# Patient Record
Sex: Male | Born: 1965
Health system: Southern US, Community
[De-identification: ages and names within clinical notes are randomized; demographics above are authoritative.]

## PROBLEM LIST (undated history)

## (undated) DIAGNOSIS — C61 Malignant neoplasm of prostate: Secondary | ICD-10-CM

## (undated) DIAGNOSIS — F79 Unspecified intellectual disabilities: Secondary | ICD-10-CM

## (undated) DIAGNOSIS — I10 Essential (primary) hypertension: Secondary | ICD-10-CM

## (undated) HISTORY — PX: COLONOSCOPY: SHX174

---

## 2011-11-12 ENCOUNTER — Encounter (HOSPITAL_COMMUNITY): Payer: Self-pay | Admitting: *Deleted

## 2011-11-12 ENCOUNTER — Emergency Department (HOSPITAL_COMMUNITY)
Admission: EM | Admit: 2011-11-12 | Discharge: 2011-11-12 | Disposition: A | Discharge status: Home / Self Care | Payer: Medicare Other | Attending: Emergency Medicine | Admitting: Emergency Medicine

## 2011-11-12 DIAGNOSIS — L2989 Other pruritus: Secondary | ICD-10-CM | POA: Insufficient documentation

## 2011-11-12 DIAGNOSIS — T6391XA Toxic effect of contact with unspecified venomous animal, accidental (unintentional), initial encounter: Secondary | ICD-10-CM | POA: Insufficient documentation

## 2011-11-12 DIAGNOSIS — Z79899 Other long term (current) drug therapy: Secondary | ICD-10-CM | POA: Insufficient documentation

## 2011-11-12 DIAGNOSIS — I10 Essential (primary) hypertension: Secondary | ICD-10-CM | POA: Insufficient documentation

## 2011-11-12 DIAGNOSIS — T63441A Toxic effect of venom of bees, accidental (unintentional), initial encounter: Secondary | ICD-10-CM

## 2011-11-12 DIAGNOSIS — R221 Localized swelling, mass and lump, neck: Secondary | ICD-10-CM | POA: Insufficient documentation

## 2011-11-12 DIAGNOSIS — R220 Localized swelling, mass and lump, head: Secondary | ICD-10-CM | POA: Insufficient documentation

## 2011-11-12 DIAGNOSIS — R21 Rash and other nonspecific skin eruption: Secondary | ICD-10-CM | POA: Insufficient documentation

## 2011-11-12 DIAGNOSIS — L298 Other pruritus: Secondary | ICD-10-CM | POA: Insufficient documentation

## 2011-11-12 DIAGNOSIS — T63461A Toxic effect of venom of wasps, accidental (unintentional), initial encounter: Secondary | ICD-10-CM | POA: Insufficient documentation

## 2011-11-12 HISTORY — DX: Essential (primary) hypertension: I10

## 2011-11-12 MED ORDER — BENADRYL 25 MG PO TABS: 25.0000 mg | ORAL_TABLET | Freq: Four times a day (QID) | ORAL | Status: DC | PRN

## 2011-11-12 MED ORDER — PREDNISONE 20 MG PO TABS
60.0000 mg | ORAL_TABLET | Freq: Once | ORAL | Status: AC
  Administered 2011-11-12: 60 mg via ORAL
  Filled 2011-11-12: qty 3

## 2011-11-12 MED ORDER — PREDNISONE 10 MG PO TABS: 20.0000 mg | ORAL_TABLET | Freq: Every day | ORAL | Status: DC

## 2011-11-12 MED ORDER — DIPHENHYDRAMINE HCL 25 MG PO CAPS
50.0000 mg | ORAL_CAPSULE | Freq: Once | ORAL | Status: AC
  Administered 2011-11-12: 50 mg via ORAL
  Filled 2011-11-12: qty 2

## 2011-11-12 NOTE — Discharge Instructions (Signed)
Mr Koepp take benadryl 25mg  every 4 hours x 24 then three times a day.  Take the prednisone as directed.  Follow up with your pcp tomorrow if not better.  If your condition worsens return to the ER especially if difficulty breathing or swollen tongue.    Bee, Wasp, or Hornet Sting Your caregiver has diagnosed you as having an insect sting. An insect sting appears as a red lump in the skin that sometimes has a tiny hole in the center, or it may have a stinger in the center of the wound. The most common stings are from wasps, hornets and bees. Individuals have different reactions to insect stings.  A normal reaction may cause pain, swelling, and redness around the sting site.   A localized allergic reaction may cause swelling and redness that extends beyond the sting site.   A large local reaction may continue to develop over the next 12 to 36 hours.   On occasion, the reactions can be severe (anaphylactic reaction). An anaphylactic reaction may cause wheezing; difficulty breathing; chest pain; fainting; raised, itchy, red patches on the skin; a sick feeling to your stomach (nausea); vomiting; cramping; or diarrhea. If you have had an anaphylactic reaction to an insect sting in the past, you are more likely to have one again.  HOME CARE INSTRUCTIONS   With bee stings, a small sac of poison is left in the wound. Brushing across this with something such as a credit card, or anything similar, will help remove this and decrease the amount of the reaction. This same procedure will not help a wasp sting as they do not leave behind a stinger and poison sac.   Apply a cold compress for 10 to 20 minutes every hour for 1 to 2 days, depending on severity, to reduce swelling and itching.   To lessen pain, a paste made of water and baking soda may be rubbed on the bite or sting and left on for 5 minutes.   To relieve itching and swelling, you may use take medication or apply medicated creams or lotions as  directed.   Only take over-the-counter or prescription medicines for pain, discomfort, or fever as directed by your caregiver.   Wash the sting site daily with soap and water. Apply antibiotic ointment on the sting site as directed.   If you suffered a severe reaction:   If you did not require hospitalization, an adult will need to stay with you for 24 hours in case the symptoms return.   You may need to wear a medical bracelet or necklace stating the allergy.   You and your family need to learn when and how to use an anaphylaxis kit or epinephrine injection.   If you have had a severe reaction before, always carry your anaphylaxis kit with you.  SEEK MEDICAL CARE IF:   None of the above helps within 2 to 3 days.   The area becomes red, warm, tender, and swollen beyond the area of the bite or sting.   You have an oral temperature above 102 F (38.9 C).  SEEK IMMEDIATE MEDICAL CARE IF:  You have symptoms of an allergic reaction which are:  Wheezing.   Difficulty breathing.   Chest pain.   Lightheadedness or fainting.   Itchy, raised, red patches on the skin.   Nausea, vomiting, cramping or diarrhea.  ANY OF THESE SYMPTOMS MAY REPRESENT A SERIOUS PROBLEM THAT IS AN EMERGENCY. Do not wait to see if the symptoms will  go away. Get medical help right away. Call your local emergency services (911 in [place] DO NOT drive yourself to the hospital. MAKE SURE YOU:   Understand these instructions.   Will watch your condition.   Will get help right away if you are not doing well or get worse.  Document Released: 04/30/2005 Document Revised: 04/19/2011 Document Reviewed: 10/15/2009 [facility] Patient Information 2012 [place], [place].

## 2011-11-12 NOTE — ED Notes (Signed)
The pt was stung by a bee to the lt side of his face at approx 1500 today pain and swelling.  No rash no itching.  He says he was not stung on his face just his lt upper arm.  No swelling in the back of his throat no diffi breathing

## 2011-11-12 NOTE — ED Notes (Addendum)
Pt here with family, here s/p "bee sting", occurred around 1530 while cutting grass, pt alert, NAD, calm, interactive, watching TV, family x2 at side, no meds PTA, no h/o bee sting or insect reaction, NKDA or food allergies (denies: pain, numbness, tingling, itching, sob, nv, dizziness, blurred vision, weakness, hot or cold or any other sx). Redness and swelling noted to L upper arm (bicep area), and L lower lip, LS CTA, throat appears unremarkable, no obvious swelling. Ice applied PTA.

## 2011-11-17 NOTE — ED Provider Notes (Signed)
History     CSN: [id number]  Arrival date & time 11/12/11  1836   First MD Initiated Contact with Patient 11/12/11 2118      Chief Complaint  Patient presents with  . Insect Bite    (Consider location/radiation/quality/duration/timing/severity/associated sxs/prior treatment) Patient is a 46 y.o. male presenting with allergic reaction. The history is provided by the patient. No language interpreter was used.  Allergic Reaction The primary symptoms are  rash. The primary symptoms do not include wheezing, shortness of breath, nausea, vomiting or dizziness. The current episode started 3 to 5 hours ago. The problem has been gradually worsening. This is a new problem.  The rash is associated with itching.  The onset of the reaction was associated with insect bite/sting. Significant symptoms also include itching.  Sting to LUE and ? L face with redness/itching to LUE site and LL lip swelling.  No tongue swelling, wheezing or difficulty breathing.  Mentally challenged patient here with his dad.    Past Medical History  Diagnosis Date  . Hypertension     History reviewed. No pertinent past surgical history.  No family history on file.  History  Substance Use Topics  . Smoking status: Never Smoker   . Smokeless tobacco: Not on file  . Alcohol Use: No      Review of Systems  Constitutional: Negative.   HENT: Negative.   Eyes: Negative.   Respiratory: Negative.  Negative for shortness of breath and wheezing.   Cardiovascular: Negative.   Gastrointestinal: Negative.  Negative for nausea and vomiting.  Skin: Positive for itching and rash.  Neurological: Negative.  Negative for dizziness.  Psychiatric/Behavioral: Negative.   All other systems reviewed and are negative.    Allergies  Review of patient's allergies indicates no known allergies.  Home Medications   Current Outpatient Rx  Name Route Sig Dispense Refill  . HYDROCHLOROTHIAZIDE 12.5 MG PO CAPS Oral Take 12.5 mg  by mouth 3 (three) times a week. Take one Monday, Wednesday, and Friday    . OLMESARTAN MEDOXOMIL 20 MG PO TABS Oral Take 20 mg by mouth daily.    Marland Kitchen VITAMIN D (ERGOCALCIFEROL) 50000 UNITS PO CAPS Oral Take 50,000 Units by mouth 2 (two) times a week. Take on Tuesday and Friday    . BENADRYL 25 MG PO TABS Oral Take 1 tablet (25 mg total) by mouth every 6 (six) hours as needed for itching. 20 tablet 0    Dispense as written.  Marland Kitchen PREDNISONE 10 MG PO TABS Oral Take 2 tablets (20 mg total) by mouth daily. 15 tablet 0    BP 139/89  Pulse 92  Temp 97.1 F (36.2 C) (Oral)  Resp 18  SpO2 99%  Physical Exam  Nursing note and vitals reviewed. Constitutional: He is oriented to person, place, and time. He appears well-developed and well-nourished.  HENT:  Head: Normocephalic.  Eyes: Conjunctivae and EOM are normal. Pupils are equal, round, and reactive to light.  Neck: Normal range of motion. Neck supple.  Cardiovascular: Normal rate.   Pulmonary/Chest: Effort normal.  Abdominal: Soft.  Musculoskeletal: Normal range of motion.  Neurological: He is alert and oriented to person, place, and time.  Skin: Skin is warm and dry. Rash noted.  Psychiatric: He has a normal mood and affect.    ED Course  Procedures (including critical care time)  Labs Reviewed - No data to display No results found.   1. Bee sting       MDM  Bee  sting to LUE and ? L face.  Better after benadryl and prednisone in the ER.  Follow up with pcp as needed.  Return for any tongue swelling or difficulty breathing.  rx for benadryl and prednisone.        Remi Haggard, NP 11/17/11 1043

## 2011-11-19 NOTE — ED Provider Notes (Signed)
Medical screening examination/treatment/procedure(s) were performed by non-physician practitioner and as supervising physician I was immediately available for consultation/collaboration.  Erling Arrazola, MD 11/19/11 0715 

## 2015-01-26 ENCOUNTER — Encounter (HOSPITAL_COMMUNITY): Payer: Self-pay | Admitting: Emergency Medicine

## 2015-01-26 ENCOUNTER — Emergency Department (HOSPITAL_COMMUNITY)
Admission: EM | Admit: 2015-01-26 | Discharge: 2015-01-26 | Disposition: A | Discharge status: Home / Self Care | Payer: Commercial Managed Care - HMO | Attending: Emergency Medicine | Admitting: Emergency Medicine

## 2015-01-26 DIAGNOSIS — K068 Other specified disorders of gingiva and edentulous alveolar ridge: Secondary | ICD-10-CM | POA: Diagnosis not present

## 2015-01-26 DIAGNOSIS — Z79899 Other long term (current) drug therapy: Secondary | ICD-10-CM | POA: Insufficient documentation

## 2015-01-26 DIAGNOSIS — I10 Essential (primary) hypertension: Secondary | ICD-10-CM | POA: Insufficient documentation

## 2015-01-26 DIAGNOSIS — D699 Hemorrhagic condition, unspecified: Secondary | ICD-10-CM | POA: Diagnosis not present

## 2015-01-26 DIAGNOSIS — K0501 Acute gingivitis, non-plaque induced: Secondary | ICD-10-CM | POA: Diagnosis not present

## 2015-01-26 DIAGNOSIS — F79 Unspecified intellectual disabilities: Secondary | ICD-10-CM | POA: Diagnosis not present

## 2015-01-26 DIAGNOSIS — Z7952 Long term (current) use of systemic steroids: Secondary | ICD-10-CM | POA: Diagnosis not present

## 2015-01-26 HISTORY — DX: Unspecified intellectual disabilities: F79

## 2015-01-26 MED ORDER — CHLORHEXIDINE GLUCONATE 0.12% ORAL RINSE (MEDLINE KIT): 15.0000 mL | Freq: Two times a day (BID) | OROMUCOSAL | Status: DC

## 2015-01-26 NOTE — Discharge Instructions (Signed)
Return to the ED with any concerns including bleeding not able to be controlled with pressure, difficulty breathing, decreased level of alertness/lethargy, or any other alarming symptoms

## 2015-01-26 NOTE — ED Provider Notes (Signed)
CSN: [id number]     Arrival date & time 01/26/15  0700 History   First MD Initiated Contact with Patient 01/26/15 [phone]     Chief Complaint  Patient presents with  . Epistaxis     (Consider location/radiation/quality/duration/timing/severity/associated sxs/prior Treatment) HPI  Pt presenting with c/o bleeding from gums.  He had his teeth cleaned at the dentist 2 days ago.  Today he woke up with blood in his mouth.  Bleeding appears to be coming from his gums.  No injury.   Does not take blood thinners.  No other areas of easy bruising or bleeding.  He has not had any treatment prior to arrival.  No coughing or vomiting.  No nosebleed.  No difficulty breathing.  There are no other associated systemic symptoms, there are no other alleviating or modifying factors.   Past Medical History  Diagnosis Date  . Hypertension   . Mental disability    History reviewed. No pertinent past surgical history. No family history on file. Social History  Substance Use Topics  . Smoking status: Never Smoker   . Smokeless tobacco: None  . Alcohol Use: No    Review of Systems  ROS reviewed and all otherwise negative except for mentioned in HPI    Allergies  Bee venom  Home Medications   Prior to Admission medications   Medication Sig Start Date End Date Taking? Authorizing Provider  BENADRYL 25 MG tablet Take 1 tablet (25 mg total) by mouth every 6 (six) hours as needed for itching. 11/12/11 12/12/11  Sheryle Hail, NP  chlorhexidine gluconate (PERIDEX) 0.12 % solution Use as directed 15 mLs in the mouth or throat 2 (two) times daily. 01/26/15   Alfonzo Beers, MD  hydrochlorothiazide (MICROZIDE) 12.5 MG capsule Take 12.5 mg by mouth 3 (three) times a week. Take one Monday, Wednesday, and Friday    Historical Provider, MD  olmesartan (BENICAR) 20 MG tablet Take 20 mg by mouth daily.    Historical Provider, MD  predniSONE (DELTASONE) 10 MG tablet Take 2 tablets (20 mg total) by mouth daily. 11/12/11    Sheryle Hail, NP  Vitamin D, Ergocalciferol, (DRISDOL) 50000 UNITS CAPS Take 50,000 Units by mouth 2 (two) times a week. Take on Tuesday and Friday    Historical Provider, MD   BP 123/77 mmHg  Pulse 88  Temp(Src) 98.3 F (36.8 C) (Oral)  Resp 15  SpO2 98%  Vitals reviewed Physical Exam  Physical Examination: General appearance - alert, well appearing, and in no distress Mental status - alert, oriented to person, place, and time Eyes - no conjunctival injection, no scleral icterus Mouth - mucous membranes moist, pharynx normal without lesions, dried blood around lips, when blood wiped away from gums, there is area of bleeding around left upper molars diffusely, no laceration or abscess present Chest - clear to auscultation, no wheezes, rales or rhonchi, symmetric air entry Heart - normal rate, regular rhythm, normal S1, S2, no murmurs, rubs, clicks or gallops Neurological - alert, oriented, normal speech, moving all extremities Extremities - peripheral pulses normal, no pedal edema, no clubbing or cyanosis Skin - normal coloration and turgor, no rashes  ED Course  Procedures (including critical care time) Labs Review Labs Reviewed - No data to display  Imaging Review No results found. I have personally reviewed and evaluated these images and lab results as part of my medical decision-making.   EKG Interpretation None      MDM   Final diagnoses:  Gingival  bleeding    Pt presenting with c/o gums bleeding.  He had teeth cleaned 2 days ago. He does not floss regularly.  There is no laceration or specific injury, but diffuse bleeding around left upper molars.  Given rx for peridex, advised regular flossing.  Discharged with strict return precautions.  Pt agreeable with plan.    Alfonzo Beers, MD 01/26/15 [phone]

## 2015-01-26 NOTE — ED Notes (Addendum)
pts caregiver states that the patient woke up with blood on his pillow and blood in his mouth. Caregiver reports he has not really seen any blood coming from the patient's nose. Pt alert, denies pain or any other complaints. resp e/u. Skin warm and dry.

## 2015-01-28 DIAGNOSIS — Z79899 Other long term (current) drug therapy: Secondary | ICD-10-CM | POA: Diagnosis not present

## 2015-01-28 DIAGNOSIS — K0501 Acute gingivitis, non-plaque induced: Secondary | ICD-10-CM | POA: Diagnosis not present

## 2015-01-28 DIAGNOSIS — Z23 Encounter for immunization: Secondary | ICD-10-CM | POA: Diagnosis not present

## 2015-01-28 DIAGNOSIS — I10 Essential (primary) hypertension: Secondary | ICD-10-CM | POA: Diagnosis not present

## 2015-02-18 DIAGNOSIS — Z23 Encounter for immunization: Secondary | ICD-10-CM | POA: Diagnosis not present

## 2015-02-18 DIAGNOSIS — Z125 Encounter for screening for malignant neoplasm of prostate: Secondary | ICD-10-CM | POA: Diagnosis not present

## 2015-02-18 DIAGNOSIS — Z1211 Encounter for screening for malignant neoplasm of colon: Secondary | ICD-10-CM | POA: Diagnosis not present

## 2015-02-18 DIAGNOSIS — Z79899 Other long term (current) drug therapy: Secondary | ICD-10-CM | POA: Diagnosis not present

## 2015-02-18 DIAGNOSIS — Z0000 Encounter for general adult medical examination without abnormal findings: Secondary | ICD-10-CM | POA: Diagnosis not present

## 2015-02-18 DIAGNOSIS — I10 Essential (primary) hypertension: Secondary | ICD-10-CM | POA: Diagnosis not present

## 2016-06-04 DIAGNOSIS — M79673 Pain in unspecified foot: Secondary | ICD-10-CM | POA: Diagnosis not present

## 2016-06-04 DIAGNOSIS — I10 Essential (primary) hypertension: Secondary | ICD-10-CM | POA: Diagnosis not present

## 2016-06-04 DIAGNOSIS — Z79899 Other long term (current) drug therapy: Secondary | ICD-10-CM | POA: Diagnosis not present

## 2016-06-04 DIAGNOSIS — Z23 Encounter for immunization: Secondary | ICD-10-CM | POA: Diagnosis not present

## 2016-10-12 DIAGNOSIS — Z79899 Other long term (current) drug therapy: Secondary | ICD-10-CM | POA: Diagnosis not present

## 2016-10-12 DIAGNOSIS — Z0000 Encounter for general adult medical examination without abnormal findings: Secondary | ICD-10-CM | POA: Diagnosis not present

## 2016-10-12 DIAGNOSIS — Z1211 Encounter for screening for malignant neoplasm of colon: Secondary | ICD-10-CM | POA: Diagnosis not present

## 2016-10-12 DIAGNOSIS — I10 Essential (primary) hypertension: Secondary | ICD-10-CM | POA: Diagnosis not present

## 2016-10-12 DIAGNOSIS — Z125 Encounter for screening for malignant neoplasm of prostate: Secondary | ICD-10-CM | POA: Diagnosis not present

## 2017-05-17 DIAGNOSIS — I10 Essential (primary) hypertension: Secondary | ICD-10-CM | POA: Diagnosis not present

## 2017-06-28 DIAGNOSIS — Z23 Encounter for immunization: Secondary | ICD-10-CM | POA: Diagnosis not present

## 2017-10-24 DIAGNOSIS — I10 Essential (primary) hypertension: Secondary | ICD-10-CM | POA: Diagnosis not present

## 2017-10-24 DIAGNOSIS — F70 Mild intellectual disabilities: Secondary | ICD-10-CM | POA: Diagnosis not present

## 2017-10-24 DIAGNOSIS — E559 Vitamin D deficiency, unspecified: Secondary | ICD-10-CM | POA: Diagnosis not present

## 2018-04-01 ENCOUNTER — Other Ambulatory Visit: Payer: Self-pay | Admitting: Nurse Practitioner

## 2018-04-04 ENCOUNTER — Other Ambulatory Visit: Payer: Self-pay | Admitting: Internal Medicine

## 2018-04-06 ENCOUNTER — Other Ambulatory Visit: Payer: Self-pay | Admitting: Internal Medicine

## 2018-04-24 ENCOUNTER — Ambulatory Visit: Payer: Self-pay | Admitting: Nurse Practitioner

## 2018-05-28 ENCOUNTER — Encounter: Payer: Self-pay | Admitting: Nurse Practitioner

## 2018-05-28 ENCOUNTER — Ambulatory Visit (INDEPENDENT_AMBULATORY_CARE_PROVIDER_SITE_OTHER): Payer: Medicare HMO | Admitting: Nurse Practitioner

## 2018-05-28 VITALS — BP 120/82 | HR 72 | Temp 98.2°F | Ht 68.0 in | Wt 183.4 lb

## 2018-05-28 DIAGNOSIS — I10 Essential (primary) hypertension: Secondary | ICD-10-CM

## 2018-05-28 MED ORDER — OLMESARTAN MEDOXOMIL 20 MG PO TABS: 20.0000 mg | ORAL_TABLET | Freq: Every day | ORAL | 1 refills | Status: DC

## 2018-05-28 NOTE — Patient Instructions (Signed)

## 2018-05-28 NOTE — Progress Notes (Signed)
  Subjective:     Patient ID: Christopher Watson , male    DOB: 04-07-1966 , 53 y.o.   MRN: [id number]   Chief Complaint  Patient presents with  . Hypertension    HPI  Hypertension  This is a chronic problem. The current episode started more than 1 year ago. The problem is unchanged. The problem is controlled. Pertinent negatives include no anxiety, chest pain, headaches or palpitations. There are no associated agents to hypertension. Risk factors for coronary artery disease include sedentary lifestyle and male gender. Past treatments include diuretics and angiotensin blockers. The current treatment provides significant improvement. There are no compliance problems.  There is no history of angina or kidney disease. There is no history of chronic renal disease.     Past Medical History:  Diagnosis Date  . Hypertension   . Mental disability      History reviewed. No pertinent family history.   Current Outpatient Medications:  .  hydrochlorothiazide (MICROZIDE) 12.5 MG capsule, TAKE 1 CAPSULE BY MOUTH ON MONDAY, WEDNESDAY, AND FRIDAY, Disp: 38 capsule, Rfl: 0 .  olmesartan (BENICAR) 20 MG tablet, TAKE 1 TABLET BY MOUTH DAILY, Disp: 90 tablet, Rfl: 0   Allergies  Allergen Reactions  . Bee Venom Swelling     Review of Systems  Constitutional: Negative.   Respiratory: Negative.  Negative for cough.   Cardiovascular: Negative.  Negative for chest pain, palpitations and leg swelling.  Neurological: Negative for dizziness and headaches.     Today's Vitals   05/28/18 1112  BP: 120/82  Pulse: 72  Temp: 98.2 F (36.8 C)  TempSrc: Oral  SpO2: 92%  Weight: 183 lb 6.4 oz (83.2 kg)  Height: 5\' 8"  (1.727 m)  PainSc: 0-No pain   Body mass index is 27.89 kg/m.   Objective:  Physical Exam Vitals signs reviewed.  Constitutional:      General: He is not in acute distress.    Appearance: Normal appearance.  Cardiovascular:     Rate and Rhythm: Normal rate and regular rhythm.   Pulses: Normal pulses.     Heart sounds: Normal heart sounds. No murmur.  Pulmonary:     Effort: Pulmonary effort is normal. No respiratory distress.     Breath sounds: Normal breath sounds.  Skin:    General: Skin is warm and dry.     Capillary Refill: Capillary refill takes less than 2 seconds.  Neurological:     General: No focal deficit present.     Mental Status: He is alert and oriented to person, place, and time.  Psychiatric:        Mood and Affect: Mood normal.        Behavior: Behavior normal.        Thought Content: Thought content normal.        Judgment: Judgment normal.         Assessment And Plan:     1. Essential hypertension . B/P is controlled.  Marland Kitchen BMP ordered to check renal function.  . The importance of regular exercise and dietary modification was stressed to the patient.  - olmesartan (BENICAR) 20 MG tablet; Take 1 tablet (20 mg total) by mouth daily.  Dispense: 90 tablet; Refill: [place], FNP

## 2018-05-29 LAB — BMP8+EGFR
BUN / CREAT RATIO: 17 (ref 9–20)
BUN: 12 mg/dL (ref 6–24)
CHLORIDE: 101 mmol/L (ref 96–106)
CO2: 23 mmol/L (ref 20–29)
Calcium: 9.6 mg/dL (ref 8.7–10.2)
Creatinine, Ser: 0.72 mg/dL — ABNORMAL LOW (ref 0.76–1.27)
GFR calc non Af Amer: 107 mL/min/{1.73_m2} (ref >59)
GFR, EST AFRICAN AMERICAN: 124 mL/min/{1.73_m2} (ref >59)
Glucose: 94 mg/dL (ref 65–99)
Potassium: 4.4 mmol/L (ref 3.5–5.2)
Sodium: 138 mmol/L (ref 134–144)

## 2018-09-16 ENCOUNTER — Other Ambulatory Visit: Payer: Self-pay | Admitting: Nurse Practitioner

## 2018-11-20 ENCOUNTER — Other Ambulatory Visit: Payer: Self-pay | Admitting: Nurse Practitioner

## 2018-11-20 DIAGNOSIS — I10 Essential (primary) hypertension: Secondary | ICD-10-CM

## 2018-11-26 ENCOUNTER — Ambulatory Visit: Payer: Medicare HMO

## 2018-11-26 ENCOUNTER — Ambulatory Visit: Payer: Medicare HMO | Admitting: Nurse Practitioner

## 2018-12-02 ENCOUNTER — Encounter: Payer: Self-pay | Admitting: Nurse Practitioner

## 2018-12-02 ENCOUNTER — Other Ambulatory Visit: Payer: Self-pay

## 2018-12-02 ENCOUNTER — Ambulatory Visit (INDEPENDENT_AMBULATORY_CARE_PROVIDER_SITE_OTHER): Payer: Medicare HMO

## 2018-12-02 ENCOUNTER — Ambulatory Visit (INDEPENDENT_AMBULATORY_CARE_PROVIDER_SITE_OTHER): Payer: Medicare HMO | Admitting: Nurse Practitioner

## 2018-12-02 VITALS — BP 114/70 | HR 86 | Temp 98.8°F | Ht 68.6 in | Wt 176.4 lb

## 2018-12-02 VITALS — BP 114/70 | HR 86 | Temp 98.8°F | Ht 68.6 in | Wt 176.0 lb

## 2018-12-02 DIAGNOSIS — I10 Essential (primary) hypertension: Secondary | ICD-10-CM

## 2018-12-02 DIAGNOSIS — Z0000 Encounter for general adult medical examination without abnormal findings: Secondary | ICD-10-CM

## 2018-12-02 LAB — POCT URINALYSIS DIPSTICK
Bilirubin, UA: NEGATIVE
Blood, UA: NEGATIVE
Glucose, UA: NEGATIVE
Ketones, UA: 15
Leukocytes, UA: NEGATIVE
Nitrite, UA: NEGATIVE
Protein, UA: NEGATIVE
Spec Grav, UA: >=1.03 — AB (ref 1.010–1.025)
Urobilinogen, UA: 0.2 E.U./dL
pH, UA: 5.5 (ref 5.0–8.0)

## 2018-12-02 LAB — POCT UA - MICROALBUMIN
Albumin/Creatinine Ratio, Urine, POC: <30
Creatinine, POC: 300 mg/dL
Microalbumin Ur, POC: 30 mg/L

## 2018-12-02 NOTE — Patient Instructions (Signed)
Christopher Watson , Thank you for taking time to come for your Medicare Wellness Visit. I appreciate your ongoing commitment to your health goals. Please review the following plan we discussed and let me know if I can assist you in the future.   Screening recommendations/referrals: Colonoscopy: decline Recommended yearly ophthalmology/optometry visit for glaucoma screening and checkup Recommended yearly dental visit for hygiene and checkup  Vaccinations: Influenza vaccine: 2019 Pneumococcal vaccine: n/a Tdap vaccine: decline Shingles vaccine: discussed    Advanced directives: Advance directive discussed with you today. Even though you declined this today please call our office should you change your mind and we can give you the proper paperwork for you to fill out.   Conditions/risks identified: overweight  Next appointment: 05/21/2019 at 10:00  Preventive Care 40-64 Years, Male Preventive care refers to lifestyle choices and visits with your health care provider that can promote health and wellness. What does preventive care include?  A yearly physical exam. This is also called an annual well check.  Dental exams once or twice a year.  Routine eye exams. Ask your health care provider how often you should have your eyes checked.  Personal lifestyle choices, including:  Daily care of your teeth and gums.  Regular physical activity.  Eating a healthy diet.  Avoiding tobacco and drug use.  Limiting alcohol use.  Practicing safe sex.  Taking low-dose aspirin every day starting at age 72. What happens during an annual well check? The services and screenings done by your health care provider during your annual well check will depend on your age, overall health, lifestyle risk factors, and family history of disease. Counseling  Your health care provider may ask you questions about your:  Alcohol use.  Tobacco use.  Drug use.  Emotional well-being.  Home and relationship  well-being.  Sexual activity.  Eating habits.  Work and work Statistician. Screening  You may have the following tests or measurements:  Height, weight, and BMI.  Blood pressure.  Lipid and cholesterol levels. These may be checked every 5 years, or more frequently if you are over 27 years old.  Skin check.  Lung cancer screening. You may have this screening every year starting at age 4 if you have a 30-pack-year history of smoking and currently smoke or have quit within the past 15 years.  Fecal occult blood test (FOBT) of the stool. You may have this test every year starting at age 53.  Flexible sigmoidoscopy or colonoscopy. You may have a sigmoidoscopy every 5 years or a colonoscopy every 10 years starting at age 49.  Prostate cancer screening. Recommendations will vary depending on your family history and other risks.  Hepatitis C blood test.  Hepatitis B blood test.  Sexually transmitted disease (STD) testing.  Diabetes screening. This is done by checking your blood sugar (glucose) after you have not eaten for a while (fasting). You may have this done every 1-3 years. Discuss your test results, treatment options, and if necessary, the need for more tests with your health care provider. Vaccines  Your health care provider may recommend certain vaccines, such as:  Influenza vaccine. This is recommended every year.  Tetanus, diphtheria, and acellular pertussis (Tdap, Td) vaccine. You may need a Td booster every 10 years.  Zoster vaccine. You may need this after age 26.  Pneumococcal 13-valent conjugate (PCV13) vaccine. You may need this if you have certain conditions and have not been vaccinated.  Pneumococcal polysaccharide (PPSV23) vaccine. You may need one or two  doses if you smoke cigarettes or if you have certain conditions. Talk to your health care provider about which screenings and vaccines you need and how often you need them. This information is not intended  to replace advice given to you by your health care provider. Make sure you discuss any questions you have with your health care provider. Document Released: 05/27/2015 Document Revised: 01/18/2016 Document Reviewed: 03/01/2015 Elsevier Interactive Patient Education  2017 [place] Prevention in the Home Falls can cause injuries. They can happen to people of all ages. There are many things you can do to make your home safe and to help prevent falls. What can I do on the outside of my home?  Regularly fix the edges of walkways and driveways and fix any cracks.  Remove anything that might make you trip as you walk through a door, such as a raised step or threshold.  Trim any bushes or trees on the path to your home.  Use bright outdoor lighting.  Clear any walking paths of anything that might make someone trip, such as rocks or tools.  Regularly check to see if handrails are loose or broken. Make sure that both sides of any steps have handrails.  Any raised decks and porches should have guardrails on the edges.  Have any leaves, snow, or ice cleared regularly.  Use sand or salt on walking paths during winter.  Clean up any spills in your garage right away. This includes oil or grease spills. What can I do in the bathroom?  Use night lights.  Install grab bars by the toilet and in the tub and shower. Do not use towel bars as grab bars.  Use non-skid mats or decals in the tub or shower.  If you need to sit down in the shower, use a plastic, non-slip stool.  Keep the floor dry. Clean up any water that spills on the floor as soon as it happens.  Remove soap buildup in the tub or shower regularly.  Attach bath mats securely with double-sided non-slip rug tape.  Do not have throw rugs and other things on the floor that can make you trip. What can I do in the bedroom?  Use night lights.  Make sure that you have a light by your bed that is easy to reach.  Do not use  any sheets or blankets that are too big for your bed. They should not hang down onto the floor.  Have a firm chair that has side arms. You can use this for support while you get dressed.  Do not have throw rugs and other things on the floor that can make you trip. What can I do in the kitchen?  Clean up any spills right away.  Avoid walking on wet floors.  Keep items that you use a lot in easy-to-reach places.  If you need to reach something above you, use a strong step stool that has a grab bar.  Keep electrical cords out of the way.  Do not use floor polish or wax that makes floors slippery. If you must use wax, use non-skid floor wax.  Do not have throw rugs and other things on the floor that can make you trip. What can I do with my stairs?  Do not leave any items on the stairs.  Make sure that there are handrails on both sides of the stairs and use them. Fix handrails that are broken or loose. Make sure that handrails are as  long as the stairways.  Check any carpeting to make sure that it is firmly attached to the stairs. Fix any carpet that is loose or worn.  Avoid having throw rugs at the top or bottom of the stairs. If you do have throw rugs, attach them to the floor with carpet tape.  Make sure that you have a light switch at the top of the stairs and the bottom of the stairs. If you do not have them, ask someone to add them for you. What else can I do to help prevent falls?  Wear shoes that:  Do not have high heels.  Have rubber bottoms.  Are comfortable and fit you well.  Are closed at the toe. Do not wear sandals.  If you use a stepladder:  Make sure that it is fully opened. Do not climb a closed stepladder.  Make sure that both sides of the stepladder are locked into place.  Ask someone to hold it for you, if possible.  Clearly mark and make sure that you can see:  Any grab bars or handrails.  First and last steps.  Where the edge of each step is.   Use tools that help you move around (mobility aids) if they are needed. These include:  Canes.  Walkers.  Scooters.  Crutches.  Turn on the lights when you go into a dark area. Replace any light bulbs as soon as they burn out.  Set up your furniture so you have a clear path. Avoid moving your furniture around.  If any of your floors are uneven, fix them.  If there are any pets around you, be aware of where they are.  Review your medicines with your doctor. Some medicines can make you feel dizzy. This can increase your chance of falling. Ask your doctor what other things that you can do to help prevent falls. This information is not intended to replace advice given to you by your health care provider. Make sure you discuss any questions you have with your health care provider. Document Released: 02/24/2009 Document Revised: 10/06/2015 Document Reviewed: 06/04/2014 Elsevier Interactive Patient Education  2017 [employer].

## 2018-12-02 NOTE — Progress Notes (Signed)
  Subjective:     Patient ID: Christopher Watson , male    DOB: December 25, 1965 , 53 y.o.   MRN: [id number]   Chief Complaint  Patient presents with  . Hypertension    HPI  Wt Readings from Last 3 Encounters: 12/02/18 : 176 lb (79.8 kg) 12/02/18 : 176 lb 6.4 oz (80 kg) 05/28/18 : 183 lb 6.4 oz (83.2 kg)  Planning to have a tooth pulled.  Check on cologuard.  Hypertension This is a chronic problem. The current episode started more than 1 year ago. The problem is unchanged. The problem is controlled. Pertinent negatives include no anxiety, chest pain, headaches or palpitations. There are no associated agents to hypertension. Risk factors for coronary artery disease include sedentary lifestyle. Past treatments include diuretics and angiotensin blockers. There are no compliance problems.  There is no history of angina. There is no history of chronic renal disease.     Past Medical History:  Diagnosis Date  . Hypertension   . Mental disability      Family History  Problem Relation Age of Onset  . Hypertension Father      Current Outpatient Medications:  .  hydrochlorothiazide (MICROZIDE) 12.5 MG capsule, TAKE ONE CAPSULE BY MOUTH ON MONDAY, WEDNESDAY, AND FRIDAY, Disp: 90 capsule, Rfl: 0 .  olmesartan (BENICAR) 20 MG tablet, TAKE 1 TABLET(20 MG) BY MOUTH DAILY, Disp: 90 tablet, Rfl: 1   Allergies  Allergen Reactions  . Bee Venom Swelling     Review of Systems  Constitutional: Negative.   Respiratory: Negative.   Cardiovascular: Negative.  Negative for chest pain, palpitations and leg swelling.  Skin: Negative.   Neurological: Negative for dizziness and headaches.  Psychiatric/Behavioral: Negative for agitation and confusion.     Today's Vitals   12/02/18 1105  BP: 114/70  Pulse: 86  Temp: 98.8 F (37.1 C)  TempSrc: Oral  Weight: 176 lb (79.8 kg)  Height: 5' 8.6" (1.742 m)   Body mass index is 26.29 kg/m.   Objective:  Physical Exam Constitutional:      Appearance:  Normal appearance.  Skin:    General: Skin is warm and dry.     Capillary Refill: Capillary refill takes less than 2 seconds.  Neurological:     Mental Status: He is alert.         Assessment And Plan:     1. Essential hypertension . B/P is well controlled.  . CMP ordered to check renal function.  . The importance of regular exercise and dietary modification was stressed to the patient. Discussed ways to increase physical activity - CMP14 + Anion Gap       [name], FNP    THE PATIENT IS ENCOURAGED TO PRACTICE SOCIAL DISTANCING DUE TO THE COVID-19 PANDEMIC.

## 2018-12-02 NOTE — Progress Notes (Signed)
Subjective:   Christopher Watson is a 53 y.o. male who presents for Medicare Annual/Subsequent preventive examination.  Review of Systems:  n/a Cardiac Risk Factors include: hypertension;male gender;sedentary lifestyle     Objective:    Vitals: BP 114/70 (BP Location: Left Arm, Patient Position: Sitting, Cuff Size: Normal)   Pulse 86   Temp 98.8 F (37.1 C) (Oral)   Ht 5' 8.6" (1.742 m)   Wt 176 lb 6.4 oz (80 kg)   SpO2 98%   BMI 26.35 kg/m   Body mass index is 26.35 kg/m.  Advanced Directives 12/02/2018 01/26/2015  Does Patient Have a Medical Advance Directive? No No  Would patient like information on creating a medical advance directive? No - Patient declined No - patient declined information    Tobacco Social History   Tobacco Use  Smoking Status Never Smoker  Smokeless Tobacco Never Used     Counseling given: Not Answered   Clinical Intake:  Pre-visit preparation completed: Yes  Pain : No/denies pain     Nutritional Status: BMI 25 -29 Overweight Nutritional Risks: None Diabetes: No  How often do you need to have someone help you when you read instructions, pamphlets, or other written materials from your doctor or pharmacy?: 1 - Never What is the last grade level you completed in school?: 12th grade  Interpreter Needed?: No  Information entered by :: NAllen LPN  Past Medical History:  Diagnosis Date  . Hypertension   . Mental disability    History reviewed. No pertinent surgical history. Family History  Problem Relation Age of Onset  . Hypertension Father    Social History   Socioeconomic History  . Marital status: Single    Spouse name: Not on file  . Number of children: Not on file  . Years of education: Not on file  . Highest education level: Not on file  Occupational History  . Occupation: unemployment  Social Needs  . Financial resource strain: Not hard at all  . Food insecurity    Worry: Never true    Inability: Never true  .  Transportation needs    Medical: No    Non-medical: No  Tobacco Use  . Smoking status: Never Smoker  . Smokeless tobacco: Never Used  Substance and Sexual Activity  . Alcohol use: No  . Drug use: Not Currently  . Sexual activity: Not Currently  Lifestyle  . Physical activity    Days per week: 0 days    Minutes per session: 0 min  . Stress: Not at all  Relationships  . Social Herbalist on phone: Not on file    Gets together: Not on file    Attends religious service: Not on file    Active member of club or organization: Not on file    Attends meetings of clubs or organizations: Not on file    Relationship status: Not on file  Other Topics Concern  . Not on file  Social History Narrative  . Not on file    Outpatient Encounter Medications as of 12/02/2018  Medication Sig  . hydrochlorothiazide (MICROZIDE) 12.5 MG capsule TAKE ONE CAPSULE BY MOUTH ON MONDAY, WEDNESDAY, AND FRIDAY  . olmesartan (BENICAR) 20 MG tablet TAKE 1 TABLET(20 MG) BY MOUTH DAILY   No facility-administered encounter medications on file as of 12/02/2018.     Activities of Daily Living In your present state of health, do you have any difficulty performing the following activities: 12/02/2018  Hearing? N  Vision? N  Difficulty concentrating or making decisions? N  Walking or climbing stairs? N  Dressing or bathing? N  Doing errands, shopping? Y  Comment always has someone with him  Preparing Food and eating ? N  Using the Toilet? N  In the past six months, have you accidently leaked urine? N  Do you have problems with loss of bowel control? N  Managing your Medications? Y  Comment father gives medications  Managing your Finances? Y  Comment father Engineer, production or managing your Housekeeping? N  Some recent data might be hidden    Patient Care Team: [name], FNP as PCP - General (General Practice)   Assessment:   This is a routine wellness examination for Christopher Watson.   Exercise Activities and Dietary recommendations Current Exercise Habits: The patient does not participate in regular exercise at present  Goals    . Exercise 150 min/wk Moderate Activity     12/02/2018, wants to start exercising       Fall Risk Fall Risk  12/02/2018 05/28/2018  Falls in the past year? 0 0  Number falls in past yr: 0 -  Risk for fall due to : Medication side effect -  Follow up Falls evaluation completed;Education provided;Falls prevention discussed -   Is the patient's home free of loose throw rugs in walkways, pet beds, electrical cords, etc?   yes      Grab bars in the bathroom? no      Handrails on the stairs?   yes      Adequate lighting?   yes  Timed Get Up and Go Performed: n/a  Depression Screen PHQ 2/9 Scores 12/02/2018 05/28/2018  PHQ - 2 Score 0 0  PHQ- 9 Score 0 -    Cognitive Function     6CIT Screen 12/02/2018  What Year? 4 points  What month? 3 points  What time? 3 points  Count back from 20 4 points  Months in reverse 4 points  Repeat phrase 10 points  Total Score 28     There is no immunization history on file for this patient.  Qualifies for Shingles Vaccine? yes  Screening Tests Health Maintenance  Topic Date Due  . HIV Screening  02/27/1981  . TETANUS/TDAP  02/27/1985  . COLONOSCOPY  02/28/2016  . INFLUENZA VACCINE  12/13/2018   Cancer Screenings: Lung: Low Dose CT Chest recommended if Age 62-80 years, 30 pack-year currently smoking OR have quit w/in 15years. Patient does not qualify. Colorectal: declines  Additional Screenings:  Hepatitis C Screening:n/a      Plan:   6 CIT was 28. Wants to start exercising.  I have personally reviewed and noted the following in the patient's chart:   . Medical and social history . Use of alcohol, tobacco or illicit drugs  . Current medications and supplements . Functional ability and status . Nutritional status . Physical activity . Advanced directives . List of other  physicians . Hospitalizations, surgeries, and ER visits in previous 12 months . Vitals . Screenings to include cognitive, depression, and falls . Referrals and appointments  In addition, I have reviewed and discussed with patient certain preventive protocols, quality metrics, and best practice recommendations. A written personalized care plan for preventive services as well as general preventive health recommendations were provided to patient.     Kellie Simmering, LPN  [id number]

## 2018-12-02 NOTE — Addendum Note (Signed)
Addended by: Glenna Durand E on: 12/02/2018 12:55 PM   Modules accepted: Orders

## 2018-12-03 LAB — CMP14 + ANION GAP
ALT: 10 IU/L (ref 0–44)
AST: 12 IU/L (ref 0–40)
Albumin/Globulin Ratio: 1.7 (ref 1.2–2.2)
Albumin: 4.8 g/dL (ref 3.8–4.9)
Alkaline Phosphatase: 93 IU/L (ref 39–117)
Anion Gap: 21 mmol/L — ABNORMAL HIGH (ref 10.0–18.0)
BUN/Creatinine Ratio: 15 (ref 9–20)
BUN: 14 mg/dL (ref 6–24)
Bilirubin Total: 0.6 mg/dL (ref 0.0–1.2)
CO2: 20 mmol/L (ref 20–29)
Calcium: 10 mg/dL (ref 8.7–10.2)
Chloride: 102 mmol/L (ref 96–106)
Creatinine, Ser: 0.92 mg/dL (ref 0.76–1.27)
GFR calc Af Amer: 110 mL/min/{1.73_m2} (ref >59)
GFR calc non Af Amer: 95 mL/min/{1.73_m2} (ref >59)
Globulin, Total: 2.8 g/dL (ref 1.5–4.5)
Glucose: 89 mg/dL (ref 65–99)
Potassium: 4.5 mmol/L (ref 3.5–5.2)
Sodium: 143 mmol/L (ref 134–144)
Total Protein: 7.6 g/dL (ref 6.0–8.5)

## 2019-01-01 ENCOUNTER — Telehealth: Payer: Self-pay | Admitting: Nurse Practitioner

## 2019-01-01 NOTE — Chronic Care Management (AMB) (Signed)
°  Chronic Care Management   Outreach Note  01/01/2019 Name: Christopher Watson MRN: [id number] DOB: 03/03/1966  Referred by: [name], FNP Reason for referral : Chronic Care Management (Initial CCM outreach was unsuccessful.)   An unsuccessful telephone outreach was attempted today. The patient was referred to the case management team by for assistance with chronic care management and care coordination.   Follow Up Plan: A HIPPA compliant phone message was left for the patient providing contact information and requesting a return call.  The care management team will reach out to the patient again over the next 7 days.  If patient returns call to provider office, please advise to call [place] at [place]  [email or web address]   ??[phone]

## 2019-01-08 NOTE — Chronic Care Management (AMB) (Signed)
°  Chronic Care Management   Outreach Note  01/08/2019 Name: Christopher Watson MRN: [id number] DOB: 1965/07/31  Referred by: [name], FNP Reason for referral : Chronic Care Management (Initial CCM outreach was unsuccessful.) and Chronic Care Management (Second CCM outreach was unsuccessful.)   A second unsuccessful telephone outreach was attempted today. The patient was referred to the case management team for assistance with chronic care management and care coordination.   Follow Up Plan: A HIPPA compliant phone message was left for the patient providing contact information and requesting a return call.  The care management team will reach out to the patient again over the next 7 days.  If patient returns call to provider office, please advise to call [place] at [place]  [email or web address]   ??[phone]

## 2019-01-12 ENCOUNTER — Telehealth: Payer: Self-pay | Admitting: Nurse Practitioner

## 2019-01-12 NOTE — Chronic Care Management (AMB) (Signed)
Chronic Care Management   Note  01/12/2019 Name: Christopher Watson MRN: [id number] DOB: 09/01/1965  Christopher Watson is a 53 y.o. year old male who is a primary care patient of [name], [place]. I reached out to Carolin Guernsey by phone today in response to a referral sent by Christopher Watson [facility] health plan.    [name] was given information about Chronic Care Management services today including:  1. CCM service includes personalized support from designated clinical staff supervised by his physician, including individualized plan of care and coordination with other care providers 2. 24/7 contact phone numbers for assistance for urgent and routine care needs. 3. Service will only be billed when office clinical staff spend 20 minutes or more in a month to coordinate care. 4. Only one practitioner may furnish and bill the service in a calendar month. 5. The patient may stop CCM services at any time (effective at the end of the month) by phone call to the office staff. 6. The patient will be responsible for cost sharing (co-pay) of up to 20% of the service fee (after annual deductible is met).  Patient agreed to services and verbal consent obtained.   Follow up plan: Telephone appointment with CCM team member scheduled for: 02/23/2019  [place]  [email or web address]   ??[phone]

## 2019-01-12 NOTE — Chronic Care Management (AMB) (Signed)
°  Chronic Care Management   Outreach Note  01/12/2019 Name: Christopher Watson MRN: [id number] DOB: 18-Sep-1965  Referred by: [name], FNP Reason for referral : Chronic Care Management (Initial CCM outreach was unsuccessful.), Chronic Care Management (Second CCM outreach was unsuccessful.), and Chronic Care Management (Third CCM outreach was unsuccessful.)   Third unsuccessful telephone outreach was attempted today. The patient was referred to the case management team for assistance with chronic care management and care coordination. The patient's primary care provider has been notified of our unsuccessful attempts to make or maintain contact with the patient. The care management team is pleased to engage with this patient at any time in the future should he/she be interested in assistance from the care management team.   Follow Up Plan: The care management team is available to follow up with the patient after provider conversation with the patient regarding recommendation for care management engagement and subsequent re-referral to the care management team.   [place]  [email or web address]   ??[phone]

## 2019-01-12 NOTE — Chronic Care Management (AMB) (Deleted)
Chronic Care Management   Note  01/12/2019 Name: Christopher Watson MRN: [id number] DOB: 30-Jun-1965  Christopher Watson is a 53 y.o. year old male who is a primary care patient of [name], [place]. I reached out to Carolin Guernsey by phone today in response to a referral sent by Christopher Watson [facility] health plan.    Christopher Watson was given information about Chronic Care Management services today including:  1. CCM service includes personalized support from designated clinical staff supervised by his physician, including individualized plan of care and coordination with other care providers 2. 24/7 contact phone numbers for assistance for urgent and routine care needs. 3. Service will only be billed when office clinical staff spend 20 minutes or more in a month to coordinate care. 4. Only one practitioner may furnish and bill the service in a calendar month. 5. The patient may stop CCM services at any time (effective at the end of the month) by phone call to the office staff. 6. The patient will be responsible for cost sharing (co-pay) of up to 20% of the service fee (after annual deductible is met).  Patients father Christopher Watson agreed to services and verbal consent obtained.   Follow up plan: Telephone appointment with CCM team member scheduled for: 02/20/2019  [place]  [email or web address]   ??[phone]

## 2019-01-12 NOTE — Chronic Care Management (AMB) (Signed)
Encounter made in error. 

## 2019-02-20 ENCOUNTER — Telehealth: Payer: Self-pay

## 2019-02-23 ENCOUNTER — Telehealth: Payer: Medicare HMO

## 2019-03-31 ENCOUNTER — Ambulatory Visit: Payer: Self-pay

## 2019-03-31 ENCOUNTER — Telehealth: Payer: Self-pay

## 2019-03-31 DIAGNOSIS — I10 Essential (primary) hypertension: Secondary | ICD-10-CM

## 2019-03-31 DIAGNOSIS — F79 Unspecified intellectual disabilities: Secondary | ICD-10-CM

## 2019-03-31 NOTE — Chronic Care Management (AMB) (Signed)
  Chronic Care Management   Outreach Note  03/31/2019 Name: Christopher Watson MRN: [id number] DOB: January 18, 1966  Referred by: [name], FNP Reason for referral : Chronic Care Management (INITIAL CCM RNCM Telephone Outreach)   An unsuccessful telephone outreach was attempted today. The patient was referred to the case management team by [name] FNP for assistance with care management and care coordination.   Follow Up Plan: Telephone follow up appointment with care management team member scheduled for: 04/21/19  Barb Merino, RN, BSN, CCM Care Management Coordinator [place] [facility]  Direct Phone: [phone]

## 2019-04-21 ENCOUNTER — Telehealth: Payer: Self-pay

## 2019-05-21 ENCOUNTER — Ambulatory Visit (INDEPENDENT_AMBULATORY_CARE_PROVIDER_SITE_OTHER): Payer: Medicare HMO | Admitting: Nurse Practitioner

## 2019-05-21 ENCOUNTER — Encounter: Payer: Self-pay | Admitting: Nurse Practitioner

## 2019-05-21 ENCOUNTER — Other Ambulatory Visit: Payer: Self-pay

## 2019-05-21 ENCOUNTER — Ambulatory Visit (INDEPENDENT_AMBULATORY_CARE_PROVIDER_SITE_OTHER): Payer: Medicare HMO

## 2019-05-21 VITALS — BP 122/76 | HR 94 | Temp 97.8°F | Ht 68.6 in | Wt 178.0 lb

## 2019-05-21 VITALS — BP 122/76 | HR 94 | Temp 97.8°F | Ht 68.6 in | Wt 178.6 lb

## 2019-05-21 DIAGNOSIS — Z0000 Encounter for general adult medical examination without abnormal findings: Secondary | ICD-10-CM

## 2019-05-21 DIAGNOSIS — Z79899 Other long term (current) drug therapy: Secondary | ICD-10-CM | POA: Diagnosis not present

## 2019-05-21 DIAGNOSIS — I10 Essential (primary) hypertension: Secondary | ICD-10-CM

## 2019-05-21 DIAGNOSIS — Z125 Encounter for screening for malignant neoplasm of prostate: Secondary | ICD-10-CM

## 2019-05-21 LAB — POCT URINALYSIS DIPSTICK
Bilirubin, UA: NEGATIVE
Blood, UA: NEGATIVE
Glucose, UA: NEGATIVE
Leukocytes, UA: NEGATIVE
Nitrite, UA: NEGATIVE
Protein, UA: NEGATIVE
Spec Grav, UA: 1.025 (ref 1.010–1.025)
Urobilinogen, UA: 1 E.U./dL
pH, UA: 7 (ref 5.0–8.0)

## 2019-05-21 LAB — POCT UA - MICROALBUMIN
Albumin/Creatinine Ratio, Urine, POC: 30
Creatinine, POC: 200 mg/dL
Microalbumin Ur, POC: 10 mg/L

## 2019-05-21 MED ORDER — OLMESARTAN MEDOXOMIL 20 MG PO TABS: 20.0000 mg | ORAL_TABLET | Freq: Every day | ORAL | 1 refills | Status: DC

## 2019-05-21 NOTE — Patient Instructions (Signed)
Health Maintenance  Topic Date Due  . INFLUENZA VACCINE  08/12/2019 (Originally 12/13/2018)  . COLONOSCOPY  12/02/2019 (Originally 02/28/2016)  . TETANUS/TDAP  12/02/2019 (Originally 02/27/1985)  . HIV Screening  12/02/2019 (Originally 02/27/1981)   Health Maintenance, Male Adopting a healthy lifestyle and getting preventive care are important in promoting health and wellness. Ask your health care provider about:  The right schedule for you to have regular tests and exams.  Things you can do on your own to prevent diseases and keep yourself healthy. What should I know about diet, weight, and exercise? Eat a healthy diet   Eat a diet that includes plenty of vegetables, fruits, low-fat dairy products, and lean protein.  Do not eat a lot of foods that are high in solid fats, added sugars, or sodium. Maintain a healthy weight Body mass index (BMI) is a measurement that can be used to identify possible weight problems. It estimates body fat based on height and weight. Your health care provider can help determine your BMI and help you achieve or maintain a healthy weight. Get regular exercise Get regular exercise. This is one of the most important things you can do for your health. Most adults should:  Exercise for at least 150 minutes each week. The exercise should increase your heart rate and make you sweat (moderate-intensity exercise).  Do strengthening exercises at least twice a week. This is in addition to the moderate-intensity exercise.  Spend less time sitting. Even light physical activity can be beneficial. Watch cholesterol and blood lipids Have your blood tested for lipids and cholesterol at 54 years of age, then have this test every 5 years. You may need to have your cholesterol levels checked more often if:  Your lipid or cholesterol levels are high.  You are older than 54 years of age.  You are at high risk for heart disease. What should I know about cancer  screening? Many types of cancers can be detected early and may often be prevented. Depending on your health history and family history, you may need to have cancer screening at various ages. This may include screening for:  Colorectal cancer.  Prostate cancer.  Skin cancer.  Lung cancer. What should I know about heart disease, diabetes, and high blood pressure? Blood pressure and heart disease  High blood pressure causes heart disease and increases the risk of stroke. This is more likely to develop in people who have high blood pressure readings, are of African descent, or are overweight.  Talk with your health care provider about your target blood pressure readings.  Have your blood pressure checked: ? Every 3-5 years if you are 23-53 years of age. ? Every year if you are 68 years old or older.  If you are between the ages of 72 and 17 and are a current or former smoker, ask your health care provider if you should have a one-time screening for abdominal aortic aneurysm (AAA). Diabetes Have regular diabetes screenings. This checks your fasting blood sugar level. Have the screening done:  Once every three years after age 43 if you are at a normal weight and have a low risk for diabetes.  More often and at a younger age if you are overweight or have a high risk for diabetes. What should I know about preventing infection? Hepatitis B If you have a higher risk for hepatitis B, you should be screened for this virus. Talk with your health care provider to find out if you are at risk  for hepatitis B infection. Hepatitis C Blood testing is recommended for:  Everyone born from [birth year] through 1965.  Anyone with known risk factors for hepatitis C. Sexually transmitted infections (STIs)  You should be screened each year for STIs, including gonorrhea and chlamydia, if: ? You are sexually active and are younger than 54 years of age. ? You are older than 54 years of age and your health care  provider tells you that you are at risk for this type of infection. ? Your sexual activity has changed since you were last screened, and you are at increased risk for chlamydia or gonorrhea. Ask your health care provider if you are at risk.  Ask your health care provider about whether you are at high risk for HIV. Your health care provider may recommend a prescription medicine to help prevent HIV infection. If you choose to take medicine to prevent HIV, you should first get tested for HIV. You should then be tested every 3 months for as long as you are taking the medicine. Follow these instructions at home: Lifestyle  Do not use any products that contain nicotine or tobacco, such as cigarettes, e-cigarettes, and chewing tobacco. If you need help quitting, ask your health care provider.  Do not use street drugs.  Do not share needles.  Ask your health care provider for help if you need support or information about quitting drugs. Alcohol use  Do not drink alcohol if your health care provider tells you not to drink.  If you drink alcohol: ? Limit how much you have to 0-2 drinks a day. ? Be aware of how much alcohol is in your drink. In the [place] one drink equals one 12 oz bottle of beer (355 mL), one 5 oz glass of wine (148 mL), or one 1 oz glass of hard liquor (44 mL). General instructions  Schedule regular health, dental, and eye exams.  Stay current with your vaccines.  Tell your health care provider if: ? You often feel depressed. ? You have ever been abused or do not feel safe at home. Summary  Adopting a healthy lifestyle and getting preventive care are important in promoting health and wellness.  Follow your health care provider's instructions about healthy diet, exercising, and getting tested or screened for diseases.  Follow your health care provider's instructions on monitoring your cholesterol and blood pressure. This information is not intended to replace advice given  to you by your health care provider. Make sure you discuss any questions you have with your health care provider. Document Revised: 04/23/2018 Document Reviewed: 04/23/2018 Elsevier Patient Education  2020 [employer].

## 2019-05-21 NOTE — Progress Notes (Signed)
This visit occurred during the SARS-CoV-2 public health emergency.  Safety protocols were in place, including screening questions prior to the visit, additional usage of staff PPE, and extensive cleaning of exam room while observing appropriate contact time as indicated for disinfecting solutions.  Subjective:   Christopher Watson is a 54 y.o. male who presents for Medicare Annual/Subsequent preventive examination.  Review of Systems:  n/a Cardiac Risk Factors include: hypertension;male gender     Objective:    Vitals: BP 122/76 (BP Location: Left Arm, Patient Position: Sitting)   Pulse 94   Temp 97.8 F (36.6 C) (Oral)   Ht 5' 8.6" (1.742 m)   Wt 178 lb (80.7 kg)   BMI 26.59 kg/m   Body mass index is 26.59 kg/m.  Advanced Directives 05/21/2019 12/02/2018 01/26/2015  Does Patient Have a Medical Advance Directive? No No No  Would patient like information on creating a medical advance directive? - No - Patient declined No - patient declined information    Tobacco Social History   Tobacco Use  Smoking Status Never Smoker  Smokeless Tobacco Never Used     Counseling given: Not Answered   Clinical Intake:  Pre-visit preparation completed: Yes  Pain : No/denies pain     Nutritional Status: BMI 25 -29 Overweight Nutritional Risks: None Diabetes: No     Interpreter Needed?: No  Information entered by :: NAllen LPN  Past Medical History:  Diagnosis Date  . Hypertension   . Mental disability    History reviewed. No pertinent surgical history. Family History  Problem Relation Age of Onset  . Hypertension Father    Social History   Socioeconomic History  . Marital status: Single    Spouse name: Not on file  . Number of children: Not on file  . Years of education: Not on file  . Highest education level: Not on file  Occupational History  . Occupation: unemployment  Tobacco Use  . Smoking status: Never Smoker  . Smokeless tobacco: Never Used  Substance and  Sexual Activity  . Alcohol use: No  . Drug use: Not Currently  . Sexual activity: Not Currently  Other Topics Concern  . Not on file  Social History Narrative  . Not on file   Social Determinants of Health   Financial Resource Strain: Low Risk   . Difficulty of Paying Living Expenses: Not hard at all  Food Insecurity: No Food Insecurity  . Worried About Charity fundraiser in the Last Year: Never true  . Ran Out of Food in the Last Year: Never true  Transportation Needs: No Transportation Needs  . Lack of Transportation (Medical): No  . Lack of Transportation (Non-Medical): No  Physical Activity: Inactive  . Days of Exercise per Week: 3 days  . Minutes of Exercise per Session: 0 min  Stress: No Stress Concern Present  . Feeling of Stress : Not at all  Social Connections:   . Frequency of Communication with Friends and Family: Not on file  . Frequency of Social Gatherings with Friends and Family: Not on file  . Attends Religious Services: Not on file  . Active Member of Clubs or Organizations: Not on file  . Attends Archivist Meetings: Not on file  . Marital Status: Not on file    Outpatient Encounter Medications as of 05/21/2019  Medication Sig  . hydrochlorothiazide (MICROZIDE) 12.5 MG capsule TAKE ONE CAPSULE BY MOUTH ON MONDAY, WEDNESDAY, AND FRIDAY  . olmesartan (BENICAR) 20 MG tablet  TAKE 1 TABLET(20 MG) BY MOUTH DAILY   No facility-administered encounter medications on file as of 05/21/2019.    Activities of Daily Living In your present state of health, do you have any difficulty performing the following activities: 05/21/2019 12/02/2018  Hearing? N N  Vision? N N  Difficulty concentrating or making decisions? Y N  Walking or climbing stairs? N N  Dressing or bathing? N N  Doing errands, shopping? Tempie Donning  Comment always has someone with him always has someone with him  Preparing Food and eating ? N N  Using the Toilet? N N  In the past six months, have you  accidently leaked urine? N N  Do you have problems with loss of bowel control? N N  Managing your Medications? Y Y  Comment dad overlooks father gives medications  Managing your Finances? Tempie Donning  Comment dad handles father manages  Housekeeping or managing your Housekeeping? N N  Some recent data might be hidden    Patient Care Team: [name], FNP as PCP - General (General Practice) Little, Claudette Stapler, RN as [facility]   Assessment:   This is a routine wellness examination for Ayham.  Exercise Activities and Dietary recommendations Current Exercise Habits: Home exercise routine, Type of exercise: walking, Frequency (Times/Week): 3  Goals    . Exercise 150 min/wk Moderate Activity     12/02/2018, wants to start exercising    . Patient Stated     05/21/2019, no goals       Fall Risk Fall Risk  05/21/2019 05/21/2019 12/02/2018 05/28/2018  Falls in the past year? 0 0 0 0  Number falls in past yr: - - 0 -  Risk for fall due to : Medication side effect - Medication side effect -  Follow up Falls evaluation completed;Education provided;Falls prevention discussed - Falls evaluation completed;Education provided;Falls prevention discussed -   Is the patient's home free of loose throw rugs in walkways, pet beds, electrical cords, etc?   yes      Grab bars in the bathroom? no      Handrails on the stairs?   yes      Adequate lighting?   yes  Timed Get Up and Go Performed: n/a  Depression Screen PHQ 2/9 Scores 05/21/2019 05/21/2019 12/02/2018 05/28/2018  PHQ - 2 Score 0 0 0 0  PHQ- 9 Score 0 - 0 -    Cognitive Function     6CIT Screen 12/02/2018  What Year? 4 points  What month? 3 points  What time? 3 points  Count back from 20 4 points  Months in reverse 4 points  Repeat phrase 10 points  Total Score 28     There is no immunization history on file for this patient.  Qualifies for Shingles Vaccine? yes  Screening Tests Health Maintenance  Topic Date  Due  . INFLUENZA VACCINE  08/12/2019 (Originally 12/13/2018)  . COLONOSCOPY  12/02/2019 (Originally 02/28/2016)  . TETANUS/TDAP  12/02/2019 (Originally 02/27/1985)  . HIV Screening  12/02/2019 (Originally 02/27/1981)   Cancer Screenings: Lung: Low Dose CT Chest recommended if Age 81-80 years, 30 pack-year currently smoking OR have quit w/in 15years. Patient does not qualify. Colorectal: may do cologuard  Additional Screenings:  Hepatitis C Screening:n/a      Plan:    Patient has no goals set at this time. Patient has a mental disability 6 CIT not administered.  I have personally reviewed and noted the following in the patient's  chart:   . Medical and social history . Use of alcohol, tobacco or illicit drugs  . Current medications and supplements . Functional ability and status . Nutritional status . Physical activity . Advanced directives . List of other physicians . Hospitalizations, surgeries, and ER visits in previous 12 months . Vitals . Screenings to include cognitive, depression, and falls . Referrals and appointments  In addition, I have reviewed and discussed with patient certain preventive protocols, quality metrics, and best practice recommendations. A written personalized care plan for preventive services as well as general preventive health recommendations were provided to patient.     Kellie Simmering, LPN  [phone]

## 2019-05-21 NOTE — Patient Instructions (Signed)
[name] , Thank you for taking time to come for your Medicare Wellness Visit. I appreciate your ongoing commitment to your health goals. Please review the following plan we discussed and let me know if I can assist you in the future.   Screening recommendations/referrals: Colonoscopy: may do cologuard Recommended yearly ophthalmology/optometry visit for glaucoma screening and checkup Recommended yearly dental visit for hygiene and checkup  Vaccinations: Influenza vaccine: will get later in season Pneumococcal vaccine: n/a Tdap vaccine: decline Shingles vaccine: decline    Advanced directives: Advance directive discussed with you today. Even though you declined this today please call our office should you change your mind and we can give you the proper paperwork for you to fill out.   Conditions/risks identified: HTN  Next appointment: 12/09/2019 at 11:00  Preventive Care 40-64 Years, Male Preventive care refers to lifestyle choices and visits with your health care provider that can promote health and wellness. What does preventive care include?  A yearly physical exam. This is also called an annual well check.  Dental exams once or twice a year.  Routine eye exams. Ask your health care provider how often you should have your eyes checked.  Personal lifestyle choices, including:  Daily care of your teeth and gums.  Regular physical activity.  Eating a healthy diet.  Avoiding tobacco and drug use.  Limiting alcohol use.  Practicing safe sex.  Taking low-dose aspirin every day starting at age 30. What happens during an annual well check? The services and screenings done by your health care provider during your annual well check will depend on your age, overall health, lifestyle risk factors, and family history of disease. Counseling  Your health care provider may ask you questions about your:  Alcohol use.  Tobacco use.  Drug use.  Emotional  well-being.  Home and relationship well-being.  Sexual activity.  Eating habits.  Work and work Statistician. Screening  You may have the following tests or measurements:  Height, weight, and BMI.  Blood pressure.  Lipid and cholesterol levels. These may be checked every 5 years, or more frequently if you are over 72 years old.  Skin check.  Lung cancer screening. You may have this screening every year starting at age 66 if you have a 30-pack-year history of smoking and currently smoke or have quit within the past 15 years.  Fecal occult blood test (FOBT) of the stool. You may have this test every year starting at age 54.  Flexible sigmoidoscopy or colonoscopy. You may have a sigmoidoscopy every 5 years or a colonoscopy every 10 years starting at age 16.  Prostate cancer screening. Recommendations will vary depending on your family history and other risks.  Hepatitis C blood test.  Hepatitis B blood test.  Sexually transmitted disease (STD) testing.  Diabetes screening. This is done by checking your blood sugar (glucose) after you have not eaten for a while (fasting). You may have this done every 1-3 years. Discuss your test results, treatment options, and if necessary, the need for more tests with your health care provider. Vaccines  Your health care provider may recommend certain vaccines, such as:  Influenza vaccine. This is recommended every year.  Tetanus, diphtheria, and acellular pertussis (Tdap, Td) vaccine. You may need a Td booster every 10 years.  Zoster vaccine. You may need this after age 11.  Pneumococcal 13-valent conjugate (PCV13) vaccine. You may need this if you have certain conditions and have not been vaccinated.  Pneumococcal polysaccharide (PPSV23) vaccine.  You may need one or two doses if you smoke cigarettes or if you have certain conditions. Talk to your health care provider about which screenings and vaccines you need and how often you need  them. This information is not intended to replace advice given to you by your health care provider. Make sure you discuss any questions you have with your health care provider. Document Released: 05/27/2015 Document Revised: 01/18/2016 Document Reviewed: 03/01/2015 Elsevier Interactive Patient Education  2017 [place] Prevention in the Home Falls can cause injuries. They can happen to people of all ages. There are many things you can do to make your home safe and to help prevent falls. What can I do on the outside of my home?  Regularly fix the edges of walkways and driveways and fix any cracks.  Remove anything that might make you trip as you walk through a door, such as a raised step or threshold.  Trim any bushes or trees on the path to your home.  Use bright outdoor lighting.  Clear any walking paths of anything that might make someone trip, such as rocks or tools.  Regularly check to see if handrails are loose or broken. Make sure that both sides of any steps have handrails.  Any raised decks and porches should have guardrails on the edges.  Have any leaves, snow, or ice cleared regularly.  Use sand or salt on walking paths during winter.  Clean up any spills in your garage right away. This includes oil or grease spills. What can I do in the bathroom?  Use night lights.  Install grab bars by the toilet and in the tub and shower. Do not use towel bars as grab bars.  Use non-skid mats or decals in the tub or shower.  If you need to sit down in the shower, use a plastic, non-slip stool.  Keep the floor dry. Clean up any water that spills on the floor as soon as it happens.  Remove soap buildup in the tub or shower regularly.  Attach bath mats securely with double-sided non-slip rug tape.  Do not have throw rugs and other things on the floor that can make you trip. What can I do in the bedroom?  Use night lights.  Make sure that you have a light by your  bed that is easy to reach.  Do not use any sheets or blankets that are too big for your bed. They should not hang down onto the floor.  Have a firm chair that has side arms. You can use this for support while you get dressed.  Do not have throw rugs and other things on the floor that can make you trip. What can I do in the kitchen?  Clean up any spills right away.  Avoid walking on wet floors.  Keep items that you use a lot in easy-to-reach places.  If you need to reach something above you, use a strong step stool that has a grab bar.  Keep electrical cords out of the way.  Do not use floor polish or wax that makes floors slippery. If you must use wax, use non-skid floor wax.  Do not have throw rugs and other things on the floor that can make you trip. What can I do with my stairs?  Do not leave any items on the stairs.  Make sure that there are handrails on both sides of the stairs and use them. Fix handrails that are broken or loose.  Make sure that handrails are as long as the stairways.  Check any carpeting to make sure that it is firmly attached to the stairs. Fix any carpet that is loose or worn.  Avoid having throw rugs at the top or bottom of the stairs. If you do have throw rugs, attach them to the floor with carpet tape.  Make sure that you have a light switch at the top of the stairs and the bottom of the stairs. If you do not have them, ask someone to add them for you. What else can I do to help prevent falls?  Wear shoes that:  Do not have high heels.  Have rubber bottoms.  Are comfortable and fit you well.  Are closed at the toe. Do not wear sandals.  If you use a stepladder:  Make sure that it is fully opened. Do not climb a closed stepladder.  Make sure that both sides of the stepladder are locked into place.  Ask someone to hold it for you, if possible.  Clearly mark and make sure that you can see:  Any grab bars or handrails.  First and last  steps.  Where the edge of each step is.  Use tools that help you move around (mobility aids) if they are needed. These include:  Canes.  Walkers.  Scooters.  Crutches.  Turn on the lights when you go into a dark area. Replace any light bulbs as soon as they burn out.  Set up your furniture so you have a clear path. Avoid moving your furniture around.  If any of your floors are uneven, fix them.  If there are any pets around you, be aware of where they are.  Review your medicines with your doctor. Some medicines can make you feel dizzy. This can increase your chance of falling. Ask your doctor what other things that you can do to help prevent falls. This information is not intended to replace advice given to you by your health care provider. Make sure you discuss any questions you have with your health care provider. Document Released: 02/24/2009 Document Revised: 10/06/2015 Document Reviewed: 06/04/2014 Elsevier Interactive Patient Education  2017 [employer].

## 2019-05-21 NOTE — Progress Notes (Addendum)
This visit occurred during the SARS-CoV-2 public health emergency.  Safety protocols were in place, including screening questions prior to the visit, additional usage of staff PPE, and extensive cleaning of exam room while observing appropriate contact time as indicated for disinfecting solutions.  Subjective:     Patient ID: Christopher Watson , male    DOB: 1966-03-26 , 54 y.o.   MRN: [id number]   Chief Complaint  Patient presents with  . Annual Exam    HPI  Here for HM     Hypertension This is a chronic problem. The current episode started more than 1 year ago. The problem is controlled. Pertinent negatives include no chest pain, headaches or palpitations. Risk factors for coronary artery disease include sedentary lifestyle and male gender. Past treatments include diuretics and angiotensin blockers (his father will hold his HCTZ if blood pressure is low. ). The current treatment provides significant improvement. There are no compliance problems.  There is no history of chronic renal disease.    Men's preventive visit. Patient Health Questionnaire (PHQ-2) is    Office Visit from 05/21/2019 in [facility]  PHQ-2 Total Score  0     Patient is on a Regular diet. Marital status: Single. Relevant history for alcohol use is:  Social History   Substance and Sexual Activity  Alcohol Use No   Relevant history for tobacco use is:  Social History   Tobacco Use  Smoking Status Never Smoker  Smokeless Tobacco Never Used   Past Medical History:  Diagnosis Date  . Hypertension   . Mental disability    Family History  Problem Relation Age of Onset  . Hypertension Father      Current Outpatient Medications:  .  hydrochlorothiazide (MICROZIDE) 12.5 MG capsule, TAKE ONE CAPSULE BY MOUTH ON MONDAY, WEDNESDAY, AND FRIDAY, Disp: 90 capsule, Rfl: 0 .  olmesartan (BENICAR) 20 MG tablet, TAKE 1 TABLET(20 MG) BY MOUTH DAILY, Disp: 90 tablet, Rfl: 1   Allergies   Allergen Reactions  . Bee Venom Swelling     Review of Systems  Constitutional: Negative.   Respiratory: Negative.   Cardiovascular: Negative.  Negative for chest pain, palpitations and leg swelling.  Neurological: Negative for dizziness and headaches.  Psychiatric/Behavioral: Negative.      Today's Vitals   05/21/19 1007  BP: 122/76  Pulse: 94  Temp: 97.8 F (36.6 C)  TempSrc: Oral  Weight: 178 lb 9.6 oz (81 kg)  Height: 5' 8.6" (1.742 m)  PainSc: 0-No pain   Body mass index is 26.68 kg/m.   Objective:  Physical Exam Vitals reviewed.  Constitutional:      Appearance: Normal appearance.  HENT:     Head: Normocephalic.     Right Ear: Tympanic membrane, ear canal and external ear normal. There is no impacted cerumen.     Left Ear: Tympanic membrane, ear canal and external ear normal. There is no impacted cerumen.  Eyes:     Pupils: Pupils are equal, round, and reactive to light.  Cardiovascular:     Rate and Rhythm: Normal rate and regular rhythm.     Pulses: Normal pulses.     Heart sounds: Normal heart sounds. No murmur.  Pulmonary:     Effort: Pulmonary effort is normal. No respiratory distress.     Breath sounds: Normal breath sounds.  Abdominal:     General: Abdomen is flat. Bowel sounds are normal. There is no distension.     Palpations: Abdomen is soft.  Musculoskeletal:        General: Normal range of motion.  Skin:    General: Skin is warm and dry.     Capillary Refill: Capillary refill takes less than 2 seconds.     Coloration: Skin is not jaundiced.  Neurological:     General: No focal deficit present.     Mental Status: He is alert and oriented to person, place, and time.     Cranial Nerves: No cranial nerve deficit.  Psychiatric:        Mood and Affect: Mood normal.        Behavior: Behavior normal.        Thought Content: Thought content normal.        Judgment: Judgment normal.         Assessment And Plan:   1. Health maintenance  examination Behavior modifications discussed and diet history reviewed.   Pt will continue to exercise regularly and modify diet with low GI, plant based foods and decrease intake of processed foods.  Recommend intake of daily multivitamin, Vitamin D, and calcium.  Recommend colonoscopy (his father has declined having a colonscopy) for preventive screenings, as well as recommend immunizations that include influenza, TDAP, and Shingles  2. Essential hypertension . B/P is controlled.  . CMP ordered to check renal function.  . The importance of regular exercise and dietary modification was stressed to the patient.  . EKG done with NSR HR 90 - POCT Urinalysis Dipstick (81002) - POCT UA - Microalbumin - EKG 12-Lead   [name], FNP    THE PATIENT IS ENCOURAGED TO PRACTICE SOCIAL DISTANCING DUE TO THE COVID-19 PANDEMIC.

## 2019-05-22 LAB — CBC
Hematocrit: 45.2 % (ref 37.5–51.0)
Hemoglobin: 15.1 g/dL (ref 13.0–17.7)
MCH: 29.5 pg (ref 26.6–33.0)
MCHC: 33.4 g/dL (ref 31.5–35.7)
MCV: 89 fL (ref 79–97)
Platelets: 240 10*3/uL (ref 150–450)
RBC: 5.11 x10E6/uL (ref 4.14–5.80)
RDW: 12.6 % (ref 11.6–15.4)
WBC: 8.6 10*3/uL (ref 3.4–10.8)

## 2019-05-22 LAB — CMP14+EGFR
ALT: 11 IU/L (ref 0–44)
AST: 14 IU/L (ref 0–40)
Albumin/Globulin Ratio: 1.7 (ref 1.2–2.2)
Albumin: 4.5 g/dL (ref 3.8–4.9)
Alkaline Phosphatase: 90 IU/L (ref 39–117)
BUN/Creatinine Ratio: 13 (ref 9–20)
BUN: 12 mg/dL (ref 6–24)
Bilirubin Total: 0.5 mg/dL (ref 0.0–1.2)
CO2: 26 mmol/L (ref 20–29)
Calcium: 9.5 mg/dL (ref 8.7–10.2)
Chloride: 101 mmol/L (ref 96–106)
Creatinine, Ser: 0.92 mg/dL (ref 0.76–1.27)
GFR calc Af Amer: 109 mL/min/{1.73_m2} (ref >59)
GFR calc non Af Amer: 95 mL/min/{1.73_m2} (ref >59)
Globulin, Total: 2.6 g/dL (ref 1.5–4.5)
Glucose: 90 mg/dL (ref 65–99)
Potassium: 4.8 mmol/L (ref 3.5–5.2)
Sodium: 139 mmol/L (ref 134–144)
Total Protein: 7.1 g/dL (ref 6.0–8.5)

## 2019-05-22 LAB — PSA: Prostate Specific Ag, Serum: 5.1 ng/mL — ABNORMAL HIGH (ref 0.0–4.0)

## 2019-05-25 ENCOUNTER — Telehealth: Payer: Self-pay

## 2019-05-25 NOTE — Telephone Encounter (Signed)
Left vm- 1st attempt to give results

## 2019-05-25 NOTE — Telephone Encounter (Signed)
-----   Message from [name], [place] sent at 05/25/2019 12:16 PM EST ----- Your PSA levels is slightly elevated, have you seen a urologist in the past?  Your kidney and blood levels are normal.

## 2019-08-24 ENCOUNTER — Other Ambulatory Visit: Payer: Self-pay | Admitting: Nurse Practitioner

## 2019-08-24 DIAGNOSIS — I10 Essential (primary) hypertension: Secondary | ICD-10-CM

## 2019-11-20 ENCOUNTER — Other Ambulatory Visit: Payer: Self-pay | Admitting: Nurse Practitioner

## 2019-12-09 ENCOUNTER — Encounter: Payer: Self-pay | Admitting: Nurse Practitioner

## 2019-12-09 ENCOUNTER — Other Ambulatory Visit: Payer: Self-pay

## 2019-12-09 ENCOUNTER — Ambulatory Visit: Payer: Medicare HMO

## 2019-12-09 ENCOUNTER — Ambulatory Visit (INDEPENDENT_AMBULATORY_CARE_PROVIDER_SITE_OTHER): Payer: Medicare HMO | Admitting: Nurse Practitioner

## 2019-12-09 VITALS — BP 114/78 | HR 99 | Temp 97.4°F | Ht 68.6 in | Wt 168.8 lb

## 2019-12-09 DIAGNOSIS — I10 Essential (primary) hypertension: Secondary | ICD-10-CM | POA: Diagnosis not present

## 2019-12-09 DIAGNOSIS — Z1211 Encounter for screening for malignant neoplasm of colon: Secondary | ICD-10-CM

## 2019-12-09 DIAGNOSIS — Z1159 Encounter for screening for other viral diseases: Secondary | ICD-10-CM | POA: Diagnosis not present

## 2019-12-09 NOTE — Patient Instructions (Signed)
COVID-19 Vaccine Information can be found at: [email or web address] For questions related to vaccine distribution or appointments, please email [email or web address] or call [phone].    

## 2019-12-09 NOTE — Progress Notes (Signed)
This visit occurred during the SARS-CoV-2 public health emergency.  Safety protocols were in place, including screening questions prior to the visit, additional usage of staff PPE, and extensive cleaning of exam room while observing appropriate contact time as indicated for disinfecting solutions.  Subjective:     Patient ID: Christopher Watson , male    DOB: [birth year] , 54 y.o.   MRN: [id number]   Chief Complaint  Patient presents with  . Hypertension    HPI  Here for 6 month hypertension follow up.    Wt Readings from Last 3 Encounters: 12/09/19 : 168 lb 12.8 oz (76.6 kg) 05/21/19 : 178 lb (80.7 kg) 05/21/19 : 178 lb 9.6 oz (81 kg)     Past Medical History:  Diagnosis Date  . Hypertension   . Mental disability      Family History  Problem Relation Age of Onset  . Hypertension Father      Current Outpatient Medications:  .  hydrochlorothiazide (MICROZIDE) 12.5 MG capsule, TAKE ONE CAPSULE BY MOUTH ON MONDAY, WEDNESDAY, FRIDAY, Disp: 90 capsule, Rfl: 0 .  olmesartan (BENICAR) 20 MG tablet, TAKE 1 TABLET(20 MG) BY MOUTH DAILY, Disp: 90 tablet, Rfl: 1   Allergies  Allergen Reactions  . Bee Venom Swelling     Review of Systems  Constitutional: Negative.  Negative for fatigue.  Respiratory: Negative.   Cardiovascular: Negative.  Negative for chest pain, palpitations and leg swelling.  Neurological: Negative for dizziness and headaches.  Psychiatric/Behavioral: Negative.      Today's Vitals   12/09/19 1057  BP: 114/78  Pulse: 99  Temp: (!) 97.4 F (36.3 C)  TempSrc: Oral  Weight: 168 lb 12.8 oz (76.6 kg)  Height: 5' 8.6" (1.742 m)  PainSc: 0-No pain   Body mass index is 25.22 kg/m.   Objective:  Physical Exam Vitals reviewed.  Constitutional:      General: He is not in acute distress.    Appearance: Normal appearance.  Cardiovascular:     Rate and Rhythm: Normal rate and regular rhythm.     Pulses: Normal pulses.     Heart sounds: Normal heart  sounds. No murmur heard.   Pulmonary:     Effort: Pulmonary effort is normal. No respiratory distress.     Breath sounds: Normal breath sounds.  Skin:    Capillary Refill: Capillary refill takes less than 2 seconds.  Neurological:     General: No focal deficit present.     Mental Status: He is alert and oriented to person, place, and time.     Cranial Nerves: No cranial nerve deficit.  Psychiatric:        Mood and Affect: Mood normal.        Behavior: Behavior normal.        Thought Content: Thought content normal.        Judgment: Judgment normal.         Assessment And Plan:     1. Essential hypertension  Chronic, excellent control.  Will trial him off of HCTZ  Return to office for blood pressure follow up in 4 weeks. - BMP8+eGFR  2. Encounter for hepatitis C screening test for low risk patient  Will check Hepatitis C screening due to recent recommendations to screen all adults 18 years and older - Hepatitis C antibody  3. Encounter for screening colonoscopy  According to USPTF Colorectal cancer Screening guidelines. Cologuard is recommended every 3 years, starting at age 65years.  Will send order for colonoscopy -  Cologuard   Declines covid 19 vaccine. Discussed risk of covid 2 and if he and his father changes his mind about the vaccine to call the office.  Patient was given opportunity to ask questions. Patient verbalized understanding of the plan and was able to repeat key elements of the plan. All questions were answered to their satisfaction.  [name], FNP   I, [name], FNP, have reviewed all documentation for this visit. The documentation on 12/09/19 for the exam, diagnosis, procedures, and orders are all accurate and complete.   THE PATIENT IS ENCOURAGED TO PRACTICE SOCIAL DISTANCING DUE TO THE COVID-19 PANDEMIC.

## 2019-12-10 LAB — BMP8+EGFR
BUN/Creatinine Ratio: 13 (ref 9–20)
BUN: 11 mg/dL (ref 6–24)
CO2: 25 mmol/L (ref 20–29)
Calcium: 9.3 mg/dL (ref 8.7–10.2)
Chloride: 101 mmol/L (ref 96–106)
Creatinine, Ser: 0.83 mg/dL (ref 0.76–1.27)
GFR calc Af Amer: 116 mL/min/{1.73_m2} (ref >59)
GFR calc non Af Amer: 100 mL/min/{1.73_m2} (ref >59)
Glucose: 99 mg/dL (ref 65–99)
Potassium: 4.2 mmol/L (ref 3.5–5.2)
Sodium: 140 mmol/L (ref 134–144)

## 2019-12-10 LAB — HEPATITIS C ANTIBODY: Hep C Virus Ab: <0.1 s/co ratio (ref 0.0–0.9)

## 2019-12-15 ENCOUNTER — Telehealth: Payer: Self-pay

## 2019-12-15 NOTE — Telephone Encounter (Signed)
-----   Message from [name], [place] sent at 12/10/2019  6:32 PM EDT ----- Labs are normal.

## 2019-12-15 NOTE — Telephone Encounter (Signed)
lvm for pt to return call for lab results  

## 2019-12-24 LAB — COLOGUARD

## 2020-01-06 ENCOUNTER — Encounter: Payer: Self-pay | Admitting: Nurse Practitioner

## 2020-01-06 ENCOUNTER — Other Ambulatory Visit: Payer: Self-pay

## 2020-01-06 ENCOUNTER — Ambulatory Visit (INDEPENDENT_AMBULATORY_CARE_PROVIDER_SITE_OTHER): Payer: Medicare HMO | Admitting: Nurse Practitioner

## 2020-01-06 VITALS — BP 128/84 | HR 86 | Temp 98.4°F | Ht 68.6 in | Wt 168.0 lb

## 2020-01-06 DIAGNOSIS — I10 Essential (primary) hypertension: Secondary | ICD-10-CM

## 2020-01-06 DIAGNOSIS — R634 Abnormal weight loss: Secondary | ICD-10-CM

## 2020-01-06 NOTE — Patient Instructions (Signed)
This is Dr. Benson Norway (stomach doctor) number they will call you. [phone].

## 2020-01-06 NOTE — Progress Notes (Signed)
I,Yamilka Roman [employer] as a Education administrator for [employer], FNP.,have documented all relevant documentation on the behalf of [name], FNP,as directed by  [name], FNP while in the presence of [name], [place]. This visit occurred during the SARS-CoV-2 public health emergency.  Safety protocols were in place, including screening questions prior to the visit, additional usage of staff PPE, and extensive cleaning of exam room while observing appropriate contact time as indicated for disinfecting solutions.  Subjective:     Patient ID: Christopher Watson , male    DOB: 1965-08-08 , 54 y.o.   MRN: [id number]   Chief Complaint  Patient presents with  . Hypertension    HPI  He is here today to follow up on decreasing his blood pressure medication (HCTZ). He is doing good without the HCTZ according to his father.    Wt Readings from Last 3 Encounters: 01/06/20 : 168 lb (76.2 kg) 12/09/19 : 168 lb 12.8 oz (76.6 kg) 05/21/19 : 178 lb (80.7 kg)  His brother was in the room today and verbalizes concerns about the patient having a 10 lb weight loss over the last 7-8 months.  Denies any changes with his diet or intentionally trying to lose weight.  He was unable to obtain an adequate specimen with the Cologuard. He is developmentally challenged so there may be some difficulty with understanding the process.     Past Medical History:  Diagnosis Date  . Hypertension   . Mental disability      Family History  Problem Relation Age of Onset  . Hypertension Father      Current Outpatient Medications:  .  olmesartan (BENICAR) 20 MG tablet, TAKE 1 TABLET(20 MG) BY MOUTH DAILY, Disp: 90 tablet, Rfl: 1   Allergies  Allergen Reactions  . Bee Venom Swelling     Review of Systems  Constitutional: Positive for unexpected weight change.  Respiratory: Negative.   Cardiovascular: Negative.   Neurological: Negative.   Psychiatric/Behavioral: Negative.      Today's Vitals   01/06/20 1139   BP: 128/84  Pulse: 86  Temp: 98.4 F (36.9 C)  TempSrc: Oral  Weight: 168 lb (76.2 kg)  Height: 5' 8.6" (1.742 m)  PainSc: 0-No pain   Body mass index is 25.1 kg/m.   Objective:  Physical Exam Constitutional:      General: He is not in acute distress.    Appearance: Normal appearance.  Cardiovascular:     Rate and Rhythm: Normal rate and regular rhythm.     Pulses: Normal pulses.     Heart sounds: Normal heart sounds. No murmur heard.   Pulmonary:     Effort: Pulmonary effort is normal. No respiratory distress.  Neurological:     General: No focal deficit present.     Mental Status: He is alert.     Cranial Nerves: No cranial nerve deficit.  Psychiatric:        Mood and Affect: Mood normal.        Behavior: Behavior normal.        Thought Content: Thought content normal.        Judgment: Judgment normal.         Assessment And Plan:     1. Essential hypertension  Doing well with only the Olmesartan  Stop the HCTZ at this time  2. Weight loss  He has had a 10 lb weight loss since January unintentionally  Will refer to GI to see if they feel he will  do okay with the colonoscopy with his ability to understand instructions.    Will also check thyroid level - TSH - Ambulatory referral to Gastroenterology     Patient was given opportunity to ask questions. Patient verbalized understanding of the plan and was able to repeat key elements of the plan. All questions were answered to their satisfaction.  [name], FNP   I, [name], FNP, have reviewed all documentation for this visit. The documentation on 01/08/20 for the exam, diagnosis, procedures, and orders are all accurate and complete.  THE PATIENT IS ENCOURAGED TO PRACTICE SOCIAL DISTANCING DUE TO THE COVID-19 PANDEMIC.

## 2020-01-07 LAB — TSH: TSH: 0.724 u[IU]/mL (ref 0.450–4.500)

## 2020-01-08 ENCOUNTER — Telehealth: Payer: Self-pay

## 2020-01-08 NOTE — Telephone Encounter (Signed)
-----   Message from [name], [place] sent at 01/08/2020 12:14 AM EDT ----- Thyroid level is normal, this is not the cause for his weight loss

## 2020-01-08 NOTE — Telephone Encounter (Signed)
lvm for someone to call back for patients lab results

## 2020-01-20 DIAGNOSIS — Q898 Other specified congenital malformations: Secondary | ICD-10-CM | POA: Diagnosis not present

## 2020-01-20 DIAGNOSIS — R634 Abnormal weight loss: Secondary | ICD-10-CM | POA: Diagnosis not present

## 2020-01-20 DIAGNOSIS — Z1211 Encounter for screening for malignant neoplasm of colon: Secondary | ICD-10-CM | POA: Diagnosis not present

## 2020-01-28 ENCOUNTER — Encounter: Payer: Self-pay | Admitting: Nurse Practitioner

## 2020-01-28 DIAGNOSIS — Z1211 Encounter for screening for malignant neoplasm of colon: Secondary | ICD-10-CM | POA: Diagnosis not present

## 2020-01-28 DIAGNOSIS — R634 Abnormal weight loss: Secondary | ICD-10-CM | POA: Diagnosis not present

## 2020-01-28 HISTORY — PX: COLONOSCOPY: SHX174

## 2020-01-28 LAB — HM COLONOSCOPY

## 2020-01-29 ENCOUNTER — Encounter: Payer: Self-pay | Admitting: Nurse Practitioner

## 2020-02-16 ENCOUNTER — Other Ambulatory Visit: Payer: Self-pay | Admitting: Nurse Practitioner

## 2020-02-16 DIAGNOSIS — I10 Essential (primary) hypertension: Secondary | ICD-10-CM

## 2020-04-04 ENCOUNTER — Telehealth: Payer: Self-pay

## 2020-04-04 NOTE — Telephone Encounter (Signed)
Patient's father notified that his paperwork has been completed and he can come pick it up. YL,RMA

## 2020-04-12 ENCOUNTER — Ambulatory Visit: Payer: Medicare HMO | Admitting: Nurse Practitioner

## 2020-05-03 ENCOUNTER — Encounter: Payer: Self-pay | Admitting: Nurse Practitioner

## 2020-05-03 ENCOUNTER — Ambulatory Visit (INDEPENDENT_AMBULATORY_CARE_PROVIDER_SITE_OTHER): Payer: Medicare HMO | Admitting: Nurse Practitioner

## 2020-05-03 ENCOUNTER — Other Ambulatory Visit: Payer: Self-pay

## 2020-05-03 VITALS — BP 122/76 | HR 100 | Temp 98.8°F | Ht 68.6 in | Wt 167.4 lb

## 2020-05-03 DIAGNOSIS — R634 Abnormal weight loss: Secondary | ICD-10-CM | POA: Diagnosis not present

## 2020-05-03 DIAGNOSIS — Z2821 Immunization not carried out because of patient refusal: Secondary | ICD-10-CM | POA: Diagnosis not present

## 2020-05-03 MED ORDER — TETANUS-DIPHTH-ACELL PERTUSSIS 5-2.5-18.5 LF-MCG/0.5 IM SUSP: 0.5000 mL | Freq: Once | INTRAMUSCULAR | 0 refills | Status: DC

## 2020-05-03 NOTE — Progress Notes (Signed)
I,Yamilka Roman [employer] as a Education administrator for [employer], FNP.,have documented all relevant documentation on the behalf of [name], FNP,as directed by  [name], FNP while in the presence of [name], [place].  This visit occurred during the SARS-CoV-2 public health emergency.  Safety protocols were in place, including screening questions prior to the visit, additional usage of staff PPE, and extensive cleaning of exam room while observing appropriate contact time as indicated for disinfecting solutions.  Subjective:     Patient ID: Christopher Watson , male    DOB: December 24, 1965 , 54 y.o.   MRN: [id number]   Chief Complaint  Patient presents with  . Weight Loss    HPI  Patient here for a f/u on his weight loss.  Wt Readings from Last 3 Encounters: 05/03/20 : 167 lb 6.4 oz (75.9 kg) 01/06/20 : 168 lb (76.2 kg) 12/09/19 : 168 lb 12.8 oz (76.6 kg)  After his mother passed he was 186 lbs and 50 inches in the waist, which was in 2005.    He is eating mostly cereal.  His father feels when he weighed before he had on heavy shoes and increased amounts of clothes.     Past Medical History:  Diagnosis Date  . Hypertension   . Mental disability      Family History  Problem Relation Age of Onset  . Hypertension Father      Current Outpatient Medications:  .  olmesartan (BENICAR) 20 MG tablet, TAKE 1 TABLET(20 MG) BY MOUTH DAILY, Disp: 90 tablet, Rfl: 1   Allergies  Allergen Reactions  . Bee Venom Swelling     Review of Systems  Constitutional: Negative.   HENT: Negative.   Eyes: Negative.   Respiratory: Negative.   Cardiovascular: Negative.   Gastrointestinal: Negative.   Endocrine: Negative.   Genitourinary: Negative.   Musculoskeletal: Negative.   Skin: Negative.   Neurological: Negative.   Hematological: Negative.   Psychiatric/Behavioral: Negative.      Today's Vitals   05/03/20 1049  BP: 122/76  Pulse: 100  Temp: 98.8 F (37.1 C)  TempSrc: Oral   Weight: 167 lb 6.4 oz (75.9 kg)  Height: 5' 8.6" (1.742 m)  PainSc: 0-No pain   Body mass index is 25.01 kg/m.   Objective:  Physical Exam Vitals reviewed.  Constitutional:      General: He is not in acute distress.    Appearance: Normal appearance.  Cardiovascular:     Rate and Rhythm: Normal rate and regular rhythm.     Pulses: Normal pulses.     Heart sounds: Normal heart sounds. No murmur heard.   Pulmonary:     Effort: Pulmonary effort is normal. No respiratory distress.     Breath sounds: No wheezing.  Neurological:     General: No focal deficit present.     Mental Status: He is alert.     Cranial Nerves: No cranial nerve deficit.  Psychiatric:        Mood and Affect: Mood normal.        Behavior: Behavior normal.        Thought Content: Thought content normal.        Judgment: Judgment normal.         Assessment And Plan:     1. Weight loss  Weight is stable, his father will let us know if he is having problems with weight   2. Tetanus, diphtheria, and acellular pertussis (Tdap) vaccination declined  Discussed importance of routine  vaccines.  Patient was given opportunity to ask questions. Patient verbalized understanding of the plan and was able to repeat key elements of the plan. All questions were answered to their satisfaction.  [name], FNP   I, [name], FNP, have reviewed all documentation for this visit. The documentation on 05/03/20 for the exam, diagnosis, procedures, and orders are all accurate and complete.   THE PATIENT IS ENCOURAGED TO PRACTICE SOCIAL DISTANCING DUE TO THE COVID-19 PANDEMIC.

## 2020-05-25 ENCOUNTER — Encounter: Payer: Self-pay | Admitting: Nurse Practitioner

## 2020-05-25 ENCOUNTER — Ambulatory Visit (INDEPENDENT_AMBULATORY_CARE_PROVIDER_SITE_OTHER): Payer: Medicare HMO | Admitting: Nurse Practitioner

## 2020-05-25 ENCOUNTER — Other Ambulatory Visit: Payer: Self-pay

## 2020-05-25 ENCOUNTER — Ambulatory Visit (INDEPENDENT_AMBULATORY_CARE_PROVIDER_SITE_OTHER): Payer: Medicare HMO

## 2020-05-25 VITALS — BP 126/80 | HR 87 | Temp 98.2°F | Ht 69.4 in | Wt 168.4 lb

## 2020-05-25 VITALS — BP 142/80 | HR 105 | Temp 98.2°F | Ht 69.4 in | Wt 168.0 lb

## 2020-05-25 DIAGNOSIS — Z79899 Other long term (current) drug therapy: Secondary | ICD-10-CM | POA: Diagnosis not present

## 2020-05-25 DIAGNOSIS — Z0000 Encounter for general adult medical examination without abnormal findings: Secondary | ICD-10-CM | POA: Diagnosis not present

## 2020-05-25 DIAGNOSIS — Z13228 Encounter for screening for other metabolic disorders: Secondary | ICD-10-CM

## 2020-05-25 DIAGNOSIS — Z125 Encounter for screening for malignant neoplasm of prostate: Secondary | ICD-10-CM | POA: Diagnosis not present

## 2020-05-25 DIAGNOSIS — I10 Essential (primary) hypertension: Secondary | ICD-10-CM | POA: Diagnosis not present

## 2020-05-25 DIAGNOSIS — Z8042 Family history of malignant neoplasm of prostate: Secondary | ICD-10-CM

## 2020-05-25 DIAGNOSIS — Z2821 Immunization not carried out because of patient refusal: Secondary | ICD-10-CM | POA: Diagnosis not present

## 2020-05-25 LAB — POCT URINALYSIS DIPSTICK
Bilirubin, UA: NEGATIVE
Blood, UA: NEGATIVE
Glucose, UA: NEGATIVE
Ketones, UA: NEGATIVE
Leukocytes, UA: NEGATIVE
Nitrite, UA: NEGATIVE
Protein, UA: NEGATIVE
Spec Grav, UA: 1.025 (ref 1.010–1.025)
Urobilinogen, UA: 0.2 E.U./dL
pH, UA: 5.5 (ref 5.0–8.0)

## 2020-05-25 LAB — POCT UA - MICROALBUMIN
Albumin/Creatinine Ratio, Urine, POC: 30
Creatinine, POC: 200 mg/dL
Microalbumin Ur, POC: 10 mg/L

## 2020-05-25 NOTE — Progress Notes (Signed)
I,Yamilka Roman [employer] as a Education administrator for [employer], FNP.,have documented all relevant documentation on the behalf of [name], FNP,as directed by  [name], FNP while in the presence of [name], [place]. This visit occurred during the SARS-CoV-2 public health emergency.  Safety protocols were in place, including screening questions prior to the visit, additional usage of staff PPE, and extensive cleaning of exam room while observing appropriate contact time as indicated for disinfecting solutions.  Subjective:     Patient ID: Christopher Watson , male    DOB: August 11, 1965 , 55 y.o.   MRN: [id number]   Chief Complaint  Patient presents with  . Annual Exam    HPI  Here for hm.  According to his father he is eating better with more baked food.   Wt Readings from Last 3 Encounters: 05/25/20 : 168 lb 6.4 oz (76.4 kg) 05/25/20 : 168 lb (76.2 kg) 05/03/20 : 167 lb 6.4 oz (75.9 kg)     Past Medical History:  Diagnosis Date  . Hypertension   . Mental disability      Family History  Problem Relation Age of Onset  . Hypertension Father      Current Outpatient Medications:  .  olmesartan (BENICAR) 20 MG tablet, TAKE 1 TABLET(20 MG) BY MOUTH DAILY, Disp: 90 tablet, Rfl: 1   Allergies  Allergen Reactions  . Bee Venom Swelling     Men's preventive visit. Patient Health Questionnaire (PHQ-2) is  [place] Office Visit from 05/25/2020 in [facility]  PHQ-2 Total Score 0     Patient is on a regular diet; eating more healthy. Minimal exercise in the house. Avoiding going outside too much due to Pandemic.  Marital status: Single. Relevant history for alcohol use is:  Social History   Substance and Sexual Activity  Alcohol Use No   Relevant history for tobacco use is:  Social History   Tobacco Use  Smoking Status Never Smoker  Smokeless Tobacco Never Used  .   Review of Systems  Constitutional: Negative.   HENT: Negative.   Eyes:  Negative.   Respiratory: Negative.   Cardiovascular: Negative.  Negative for chest pain, palpitations and leg swelling.  Gastrointestinal: Negative.   Endocrine: Negative.   Genitourinary: Negative.   Musculoskeletal: Negative.   Skin: Negative.   Neurological: Negative.   Hematological: Negative.   Psychiatric/Behavioral: Negative.      Today's Vitals   05/25/20 0955  BP: (!) 142/80  Pulse: (!) 105  Temp: 98.2 F (36.8 C)  TempSrc: Oral  Weight: 168 lb 6.4 oz (76.4 kg)  Height: 5' 9.4" (1.763 m)  PainSc: 0-No pain   Body mass index is 24.58 kg/m.   Objective:  Physical Exam Constitutional:      Appearance: Normal appearance.  HENT:     Head: Normocephalic and atraumatic.     Right Ear: External ear normal. There is no impacted cerumen.     Left Ear: External ear normal. There is no impacted cerumen.  Cardiovascular:     Rate and Rhythm: Normal rate and regular rhythm.     Pulses: Normal pulses.     Heart sounds: Normal heart sounds.  Pulmonary:     Effort: Pulmonary effort is normal.     Breath sounds: Normal breath sounds.  Abdominal:     General: Abdomen is flat. Bowel sounds are normal.     Palpations: Abdomen is soft.  Musculoskeletal:        General: Normal  range of motion.     Cervical back: Normal range of motion and neck supple.  Skin:    General: Skin is warm and dry.     Capillary Refill: Capillary refill takes less than 2 seconds.  Neurological:     General: No focal deficit present.     Mental Status: He is alert and oriented to person, place, and time.  Psychiatric:        Mood and Affect: Mood normal.        Behavior: Behavior normal.        Thought Content: Thought content normal.        Judgment: Judgment normal.         Assessment And Plan:    1. Encounter for general adult medical examination w/o abnormal findings . Behavior modifications discussed and diet history reviewed.   . Pt will continue to exercise regularly and modify  diet with low GI, plant based foods and decrease intake of processed foods.  . Recommend intake of daily multivitamin, Vitamin D, and calcium.  . Recommend mammogram and colonoscopy for preventive screenings, as well as recommend immunizations that include influenza (declines covid and influenza vaccine), TDAP  2. COVID-19 vaccination declined  Declines covid 19 vaccine. Discussed risk of covid 38 and if he changes her mind about the vaccine to call the office.  Encouraged to take multivitamin, vitamin d, vitamin c and zinc to increase immune system. Aware can call office if would like to have vaccine here at office.   3. Influenza vaccination declined  Patient declined influenza vaccination at this time. Patient is aware that influenza vaccine prevents illness in 70% of healthy people, and reduces hospitalizations to 30-70% in elderly. This vaccine is recommended annually. Pt is willing to accept risk associated with refusing vaccination.  4. Other long term (current) drug therapy - CBC  5. Encounter for screening for metabolic disorder - Lipid panel  6. Encounter for prostate cancer screening Will check PSA his father has a history of prostate cancer - PSA  7. Family history of prostate cancer  8. Essential hypertension  Chronic, blood pressure is slightly elevated but the recheck was better - POCT Urinalysis Dipstick (81002) - POCT UA - Microalbumin - EKG 12-Lead - CMP14+EGFR     Patient was given opportunity to ask questions. Patient verbalized understanding of the plan and was able to repeat key elements of the plan. All questions were answered to their satisfaction.   [name], FNP    I, [name], FNP, have reviewed all documentation for this visit. The documentation on 05/25/20 for the exam, diagnosis, procedures, and orders are all accurate and complete.   THE PATIENT IS ENCOURAGED TO PRACTICE SOCIAL DISTANCING DUE TO THE COVID-19 PANDEMIC.

## 2020-05-25 NOTE — Patient Instructions (Addendum)
Health Maintenance, Male Adopting a healthy lifestyle and getting preventive care are important in promoting health and wellness. Ask your health care provider about:  The right schedule for you to have regular tests and exams.  Things you can do on your own to prevent diseases and keep yourself healthy. What should I know about diet, weight, and exercise? Eat a healthy diet  Eat a diet that includes plenty of vegetables, fruits, low-fat dairy products, and lean protein.  Do not eat a lot of foods that are high in solid fats, added sugars, or sodium.   Maintain a healthy weight Body mass index (BMI) is a measurement that can be used to identify possible weight problems. It estimates body fat based on height and weight. Your health care provider can help determine your BMI and help you achieve or maintain a healthy weight. Get regular exercise Get regular exercise. This is one of the most important things you can do for your health. Most adults should:  Exercise for at least 150 minutes each week. The exercise should increase your heart rate and make you sweat (moderate-intensity exercise).  Do strengthening exercises at least twice a week. This is in addition to the moderate-intensity exercise.  Spend less time sitting. Even light physical activity can be beneficial. Watch cholesterol and blood lipids Have your blood tested for lipids and cholesterol at 55 years of age, then have this test every 5 years. You may need to have your cholesterol levels checked more often if:  Your lipid or cholesterol levels are high.  You are older than 55 years of age.  You are at high risk for heart disease. What should I know about cancer screening? Many types of cancers can be detected early and may often be prevented. Depending on your health history and family history, you may need to have cancer screening at various ages. This may include screening for:  Colorectal cancer.  Prostate  cancer.  Skin cancer.  Lung cancer. What should I know about heart disease, diabetes, and high blood pressure? Blood pressure and heart disease  High blood pressure causes heart disease and increases the risk of stroke. This is more likely to develop in people who have high blood pressure readings, are of African descent, or are overweight.  Talk with your health care provider about your target blood pressure readings.  Have your blood pressure checked: ? Every 3-5 years if you are 18-39 years of age. ? Every year if you are 40 years old or older.  If you are between the ages of 65 and 75 and are a current or former smoker, ask your health care provider if you should have a one-time screening for abdominal aortic aneurysm (AAA). Diabetes Have regular diabetes screenings. This checks your fasting blood sugar level. Have the screening done:  Once every three years after age 45 if you are at a normal weight and have a low risk for diabetes.  More often and at a younger age if you are overweight or have a high risk for diabetes. What should I know about preventing infection? Hepatitis B If you have a higher risk for hepatitis B, you should be screened for this virus. Talk with your health care provider to find out if you are at risk for hepatitis B infection. Hepatitis C Blood testing is recommended for:  Everyone born from 1945 through 1965.  Anyone with known risk factors for hepatitis C. Sexually transmitted infections (STIs)  You should be screened each   year for STIs, including gonorrhea and chlamydia, if: ? You are sexually active and are younger than 55 years of age. ? You are older than 55 years of age and your health care provider tells you that you are at risk for this type of infection. ? Your sexual activity has changed since you were last screened, and you are at increased risk for chlamydia or gonorrhea. Ask your health care provider if you are at risk.  Ask your  health care provider about whether you are at high risk for HIV. Your health care provider may recommend a prescription medicine to help prevent HIV infection. If you choose to take medicine to prevent HIV, you should first get tested for HIV. You should then be tested every 3 months for as long as you are taking the medicine. Follow these instructions at home: Lifestyle  Do not use any products that contain nicotine or tobacco, such as cigarettes, e-cigarettes, and chewing tobacco. If you need help quitting, ask your health care provider.  Do not use street drugs.  Do not share needles.  Ask your health care provider for help if you need support or information about quitting drugs. Alcohol use  Do not drink alcohol if your health care provider tells you not to drink.  If you drink alcohol: ? Limit how much you have to 0-2 drinks a day. ? Be aware of how much alcohol is in your drink. In the [place] one drink equals one 12 oz bottle of beer (355 mL), one 5 oz glass of wine (148 mL), or one 1 oz glass of hard liquor (44 mL). General instructions  Schedule regular health, dental, and eye exams.  Stay current with your vaccines.  Tell your health care provider if: ? You often feel depressed. ? You have ever been abused or do not feel safe at home. Summary  Adopting a healthy lifestyle and getting preventive care are important in promoting health and wellness.  Follow your health care provider's instructions about healthy diet, exercising, and getting tested or screened for diseases.  Follow your health care provider's instructions on monitoring your cholesterol and blood pressure. This information is not intended to replace advice given to you by your health care provider. Make sure you discuss any questions you have with your health care provider. Document Revised: 04/23/2018 Document Reviewed: 04/23/2018 Elsevier Patient Education  2021 [place].  COVID-19 Vaccine Information  can be found at: ShippingSc[employer].co.uk For questions related to vaccine distribution or appointments, please email [email or web address] or call [phone].     Encouraged to take multivitamin, vitamin d, vitamin c and zinc to increase immune system. Aware can call office if would like to have vaccine here at office.

## 2020-05-25 NOTE — Progress Notes (Signed)
This visit occurred during the SARS-CoV-2 public health emergency.  Safety protocols were in place, including screening questions prior to the visit, additional usage of staff PPE, and extensive cleaning of exam room while observing appropriate contact time as indicated for disinfecting solutions.  Subjective:   Christopher Watson is a 55 y.o. male who presents for Medicare Annual/Subsequent preventive examination.  Review of Systems     Cardiac Risk Factors include: hypertension;male gender;sedentary lifestyle     Objective:    Today's Vitals   05/25/20 1015  BP: (!) 142/80  Pulse: (!) 105  Temp: 98.2 F (36.8 C)  TempSrc: Oral  Weight: 168 lb (76.2 kg)  Height: 5' 9.4" (1.763 m)   Body mass index is 24.52 kg/m.  Advanced Directives 05/25/2020 05/21/2019 12/02/2018 01/26/2015  Does Patient Have a Medical Advance Directive? No No No No  Would patient like information on creating a medical advance directive? No - Patient declined - No - Patient declined No - patient declined information    Current Medications (verified) Outpatient Encounter Medications as of 05/25/2020  Medication Sig  . olmesartan (BENICAR) 20 MG tablet TAKE 1 TABLET(20 MG) BY MOUTH DAILY   No facility-administered encounter medications on file as of 05/25/2020.    Allergies (verified) Bee venom   History: Past Medical History:  Diagnosis Date  . Hypertension   . Mental disability    History reviewed. No pertinent surgical history. Family History  Problem Relation Age of Onset  . Hypertension Father    Social History   Socioeconomic History  . Marital status: Single    Spouse name: Not on file  . Number of children: Not on file  . Years of education: Not on file  . Highest education level: Not on file  Occupational History  . Occupation: unemployment  Tobacco Use  . Smoking status: Never Smoker  . Smokeless tobacco: Never Used  Vaping Use  . Vaping Use: Never used  Substance and Sexual  Activity  . Alcohol use: No  . Drug use: Not Currently  . Sexual activity: Not Currently  Other Topics Concern  . Not on file  Social History Narrative  . Not on file   Social Determinants of Health   Financial Resource Strain: Low Risk   . Difficulty of Paying Living Expenses: Not hard at all  Food Insecurity: No Food Insecurity  . Worried About Charity fundraiser in the Last Year: Never true  . Ran Out of Food in the Last Year: Never true  Transportation Needs: No Transportation Needs  . Lack of Transportation (Medical): No  . Lack of Transportation (Non-Medical): No  Physical Activity: Inactive  . Days of Exercise per Week: 0 days  . Minutes of Exercise per Session: 0 min  Stress: No Stress Concern Present  . Feeling of Stress : Not at all  Social Connections: Not on file    Tobacco Counseling Counseling given: Not Answered   Clinical Intake:  Pre-visit preparation completed: Yes  Pain : No/denies pain     Nutritional Status: BMI of 19-24  Normal Nutritional Risks: None Diabetes: No     Diabetic? no  Interpreter Needed?: No  Information entered by :: NAllen LPN   Activities of Daily Living In your present state of health, do you have any difficulty performing the following activities: 05/25/2020  Hearing? N  Vision? N  Difficulty concentrating or making decisions? Y  Walking or climbing stairs? N  Dressing or bathing? N  Doing errands,  shopping? Y  Comment dad takes  Conservation officer, nature and eating ? N  Using the Toilet? N  In the past six months, have you accidently leaked urine? N  Do you have problems with loss of bowel control? N  Managing your Medications? Y  Comment dad manages  Managing your Finances? Y  Comment dad manages  Housekeeping or managing your Housekeeping? N  Some recent data might be hidden    Patient Care Team: [name], FNP as PCP - General (General Practice) [place], Claudette Stapler, RN as [place] any recent [place] you may have received from other than [facility] providers in the past year (date may be approximate).     Assessment:   This is a routine wellness examination for Christopher Watson.  Hearing/Vision screen No exam data present  Dietary issues and exercise activities discussed: Current Exercise Habits: The patient does not participate in regular exercise at present  Goals    . Exercise 150 min/wk Moderate Activity     12/02/2018, wants to start exercising    . Patient Stated     05/21/2019, no goals    . Patient Stated     05/25/2020, no goals      Depression Screen PHQ 2/9 Scores 05/25/2020 05/25/2020 05/21/2019 05/21/2019 12/02/2018 05/28/2018  PHQ - 2 Score 0 0 0 0 0 0  PHQ- 9 Score - - 0 - 0 -    Fall Risk Fall Risk  05/25/2020 05/21/2019 05/21/2019 12/02/2018 05/28/2018  Falls in the past year? 0 0 0 0 0  Number falls in past yr: - - - 0 -  Risk for fall due to : Medication side effect Medication side effect - Medication side effect -  Follow up Falls evaluation completed;Education provided;Falls prevention discussed Falls evaluation completed;Education provided;Falls prevention discussed - Falls evaluation completed;Education provided;Falls prevention discussed -    FALL RISK PREVENTION PERTAINING TO THE HOME:  Any stairs in or around the home? Yes  If so, are there any without handrails? No  Home free of loose throw rugs in walkways, pet beds, electrical cords, etc? Yes  Adequate lighting in your home to reduce risk of falls? Yes   ASSISTIVE DEVICES UTILIZED TO PREVENT FALLS:  Life alert? No  Use of a cane, walker or w/c? No  Grab bars in the bathroom? No  Shower chair or bench in shower? No  Elevated toilet seat or a handicapped toilet? No   TIMED UP AND GO:  Was the test performed? No   Cognitive Function:     6CIT Screen 12/02/2018  What Year? 4 points  What month? 3 points  What time? 3 points  Count back from 20 4 points  Months  in reverse 4 points  Repeat phrase 10 points  Total Score 28    Immunizations  There is no immunization history on file for this patient.  TDAP status: Due, Education has been provided regarding the importance of this vaccine. Advised may receive this vaccine at local pharmacy or Health Dept. Aware to provide a copy of the vaccination record if obtained from local pharmacy or Health Dept. Verbalized acceptance and understanding.  Flu Vaccine status: Declined, Education has been provided regarding the importance of this vaccine but patient still declined. Advised may receive this vaccine at local pharmacy or Health Dept. Aware to provide a copy of the vaccination record if obtained from local pharmacy or Health Dept. Verbalized acceptance and understanding.  Pneumococcal vaccine status:  Up to date  Covid-19 vaccine status: Declined, Education has been provided regarding the importance of this vaccine but patient still declined. Advised may receive this vaccine at local pharmacy or Health Dept.or vaccine clinic. Aware to provide a copy of the vaccination record if obtained from local pharmacy or Health Dept. Verbalized acceptance and understanding.  Qualifies for Shingles Vaccine? Yes   Zostavax completed No   Shingrix Completed?: No.    Education has been provided regarding the importance of this vaccine. Patient has been advised to call insurance company to determine out of pocket expense if they have not yet received this vaccine. Advised may also receive vaccine at local pharmacy or Health Dept. Verbalized acceptance and understanding.  Screening Tests Health Maintenance  Topic Date Due  . COVID-19 Vaccine (1) 06/10/2020 (Originally 02/27/1978)  . INFLUENZA VACCINE  08/11/2020 (Originally 12/13/2019)  . TETANUS/TDAP  05/03/2021 (Originally 02/27/1985)  . HIV Screening  05/03/2021 (Originally 02/27/1981)  . COLONOSCOPY (Pts 45-21yrs Insurance coverage will need to be confirmed)  01/27/2030   . Hepatitis C Screening  Completed    Health Maintenance  There are no preventive care reminders to display for this patient.  Colorectal cancer screening: Type of screening: Colonoscopy. Completed 01/28/2020. Repeat every 10 years  Lung Cancer Screening: (Low Dose CT Chest recommended if Age 74-80 years, 30 pack-year currently smoking OR have quit w/in 15years.) does not qualify.   Lung Cancer Screening Referral: no  Additional Screening:  Hepatitis C Screening: does qualify; Completed 12/09/2019  Vision Screening: Recommended annual ophthalmology exams for early detection of glaucoma and other disorders of the eye. Is the patient up to date with their annual eye exam?  No  Who is the provider or what is the name of the office in which the patient attends annual eye exams? none If pt is not established with a provider, would they like to be referred to a provider to establish care? No .   Dental Screening: Recommended annual dental exams for proper oral hygiene  Community Resource Referral / Chronic Care Management: CRR required this visit?  No   CCM required this visit?  No      Plan:     I have personally reviewed and noted the following in the patient's chart:   . Medical and social history . Use of alcohol, tobacco or illicit drugs  . Current medications and supplements . Functional ability and status . Nutritional status . Physical activity . Advanced directives . List of other physicians . Hospitalizations, surgeries, and ER visits in previous 12 months . Vitals . Screenings to include cognitive, depression, and falls . Referrals and appointments  In addition, I have reviewed and discussed with patient certain preventive protocols, quality metrics, and best practice recommendations. A written personalized care plan for preventive services as well as general preventive health recommendations were provided to patient.     Kellie Simmering, LPN   09/07/621    Nurse Notes: 6 CIT not administered. Patient has a mental disability.

## 2020-05-25 NOTE — Patient Instructions (Signed)
Christopher Watson , Thank you for taking time to come for your Medicare Wellness Visit. I appreciate your ongoing commitment to your health goals. Please review the following plan we discussed and let me know if I can assist you in the future.   Screening recommendations/referrals: Colonoscopy: completed 01/28/2020, due 01/27/2030 Recommended yearly ophthalmology/optometry visit for glaucoma screening and checkup Recommended yearly dental visit for hygiene and checkup  Vaccinations: Influenza vaccine: decline Pneumococcal vaccine: n/a Tdap vaccine: decline Shingles vaccine: decline   Covid-19:  decline  Advanced directives: Advance directive discussed with you today. Even though you declined this today please call our office should you change your mind and we can give you the proper paperwork for you to fill out.  Conditions/risks identified: none  Next appointment: Follow up in one year for your annual wellness visit   Preventive Care 40-64 Years, Male Preventive care refers to lifestyle choices and visits with your health care provider that can promote health and wellness. What does preventive care include?  A yearly physical exam. This is also called an annual well check.  Dental exams once or twice a year.  Routine eye exams. Ask your health care provider how often you should have your eyes checked.  Personal lifestyle choices, including:  Daily care of your teeth and gums.  Regular physical activity.  Eating a healthy diet.  Avoiding tobacco and drug use.  Limiting alcohol use.  Practicing safe sex.  Taking low-dose aspirin every day starting at age 72. What happens during an annual well check? The services and screenings done by your health care provider during your annual well check will depend on your age, overall health, lifestyle risk factors, and family history of disease. Counseling  Your health care provider may ask you questions about your:  Alcohol  use.  Tobacco use.  Drug use.  Emotional well-being.  Home and relationship well-being.  Sexual activity.  Eating habits.  Work and work Statistician. Screening  You may have the following tests or measurements:  Height, weight, and BMI.  Blood pressure.  Lipid and cholesterol levels. These may be checked every 5 years, or more frequently if you are over 82 years old.  Skin check.  Lung cancer screening. You may have this screening every year starting at age 66 if you have a 30-pack-year history of smoking and currently smoke or have quit within the past 15 years.  Fecal occult blood test (FOBT) of the stool. You may have this test every year starting at age 57.  Flexible sigmoidoscopy or colonoscopy. You may have a sigmoidoscopy every 5 years or a colonoscopy every 10 years starting at age 28.  Prostate cancer screening. Recommendations will vary depending on your family history and other risks.  Hepatitis C blood test.  Hepatitis B blood test.  Sexually transmitted disease (STD) testing.  Diabetes screening. This is done by checking your blood sugar (glucose) after you have not eaten for a while (fasting). You may have this done every 1-3 years. Discuss your test results, treatment options, and if necessary, the need for more tests with your health care provider. Vaccines  Your health care provider may recommend certain vaccines, such as:  Influenza vaccine. This is recommended every year.  Tetanus, diphtheria, and acellular pertussis (Tdap, Td) vaccine. You may need a Td booster every 10 years.  Zoster vaccine. You may need this after age 30.  Pneumococcal 13-valent conjugate (PCV13) vaccine. You may need this if you have certain conditions and have not  been vaccinated.  Pneumococcal polysaccharide (PPSV23) vaccine. You may need one or two doses if you smoke cigarettes or if you have certain conditions. Talk to your health care provider about which screenings  and vaccines you need and how often you need them. This information is not intended to replace advice given to you by your health care provider. Make sure you discuss any questions you have with your health care provider. Document Released: 05/27/2015 Document Revised: 01/18/2016 Document Reviewed: 03/01/2015 Elsevier Interactive Patient Education  2017 [place] Prevention in the Home Falls can cause injuries. They can happen to people of all ages. There are many things you can do to make your home safe and to help prevent falls. What can I do on the outside of my home?  Regularly fix the edges of walkways and driveways and fix any cracks.  Remove anything that might make you trip as you walk through a door, such as a raised step or threshold.  Trim any bushes or trees on the path to your home.  Use bright outdoor lighting.  Clear any walking paths of anything that might make someone trip, such as rocks or tools.  Regularly check to see if handrails are loose or broken. Make sure that both sides of any steps have handrails.  Any raised decks and porches should have guardrails on the edges.  Have any leaves, snow, or ice cleared regularly.  Use sand or salt on walking paths during winter.  Clean up any spills in your garage right away. This includes oil or grease spills. What can I do in the bathroom?  Use night lights.  Install grab bars by the toilet and in the tub and shower. Do not use towel bars as grab bars.  Use non-skid mats or decals in the tub or shower.  If you need to sit down in the shower, use a plastic, non-slip stool.  Keep the floor dry. Clean up any water that spills on the floor as soon as it happens.  Remove soap buildup in the tub or shower regularly.  Attach bath mats securely with double-sided non-slip rug tape.  Do not have throw rugs and other things on the floor that can make you trip. What can I do in the bedroom?  Use night  lights.  Make sure that you have a light by your bed that is easy to reach.  Do not use any sheets or blankets that are too big for your bed. They should not hang down onto the floor.  Have a firm chair that has side arms. You can use this for support while you get dressed.  Do not have throw rugs and other things on the floor that can make you trip. What can I do in the kitchen?  Clean up any spills right away.  Avoid walking on wet floors.  Keep items that you use a lot in easy-to-reach places.  If you need to reach something above you, use a strong step stool that has a grab bar.  Keep electrical cords out of the way.  Do not use floor polish or wax that makes floors slippery. If you must use wax, use non-skid floor wax.  Do not have throw rugs and other things on the floor that can make you trip. What can I do with my stairs?  Do not leave any items on the stairs.  Make sure that there are handrails on both sides of the stairs and use them.  Fix handrails that are broken or loose. Make sure that handrails are as long as the stairways.  Check any carpeting to make sure that it is firmly attached to the stairs. Fix any carpet that is loose or worn.  Avoid having throw rugs at the top or bottom of the stairs. If you do have throw rugs, attach them to the floor with carpet tape.  Make sure that you have a light switch at the top of the stairs and the bottom of the stairs. If you do not have them, ask someone to add them for you. What else can I do to help prevent falls?  Wear shoes that:  Do not have high heels.  Have rubber bottoms.  Are comfortable and fit you well.  Are closed at the toe. Do not wear sandals.  If you use a stepladder:  Make sure that it is fully opened. Do not climb a closed stepladder.  Make sure that both sides of the stepladder are locked into place.  Ask someone to hold it for you, if possible.  Clearly mark and make sure that you can  see:  Any grab bars or handrails.  First and last steps.  Where the edge of each step is.  Use tools that help you move around (mobility aids) if they are needed. These include:  Canes.  Walkers.  Scooters.  Crutches.  Turn on the lights when you go into a dark area. Replace any light bulbs as soon as they burn out.  Set up your furniture so you have a clear path. Avoid moving your furniture around.  If any of your floors are uneven, fix them.  If there are any pets around you, be aware of where they are.  Review your medicines with your doctor. Some medicines can make you feel dizzy. This can increase your chance of falling. Ask your doctor what other things that you can do to help prevent falls. This information is not intended to replace advice given to you by your health care provider. Make sure you discuss any questions you have with your health care provider. Document Released: 02/24/2009 Document Revised: 10/06/2015 Document Reviewed: 06/04/2014 Elsevier Interactive Patient Education  2017 [employer].

## 2020-05-26 ENCOUNTER — Other Ambulatory Visit: Payer: Self-pay | Admitting: Nurse Practitioner

## 2020-05-26 DIAGNOSIS — R972 Elevated prostate specific antigen [PSA]: Secondary | ICD-10-CM

## 2020-05-26 LAB — CMP14+EGFR
ALT: 8 IU/L (ref 0–44)
AST: 14 IU/L (ref 0–40)
Albumin/Globulin Ratio: 1.5 (ref 1.2–2.2)
Albumin: 4.5 g/dL (ref 3.8–4.9)
Alkaline Phosphatase: 91 IU/L (ref 44–121)
BUN/Creatinine Ratio: 14 (ref 9–20)
BUN: 12 mg/dL (ref 6–24)
Bilirubin Total: 0.5 mg/dL (ref 0.0–1.2)
CO2: 26 mmol/L (ref 20–29)
Calcium: 9.7 mg/dL (ref 8.7–10.2)
Chloride: 98 mmol/L (ref 96–106)
Creatinine, Ser: 0.87 mg/dL (ref 0.76–1.27)
GFR calc Af Amer: 113 mL/min/{1.73_m2} (ref >59)
GFR calc non Af Amer: 98 mL/min/{1.73_m2} (ref >59)
Globulin, Total: 3 g/dL (ref 1.5–4.5)
Glucose: 86 mg/dL (ref 65–99)
Potassium: 4.2 mmol/L (ref 3.5–5.2)
Sodium: 138 mmol/L (ref 134–144)
Total Protein: 7.5 g/dL (ref 6.0–8.5)

## 2020-05-26 LAB — LIPID PANEL
Chol/HDL Ratio: 4.6 ratio (ref 0.0–5.0)
Cholesterol, Total: 180 mg/dL (ref 100–199)
HDL: 39 mg/dL — ABNORMAL LOW (ref >39)
LDL Chol Calc (NIH): 114 mg/dL — ABNORMAL HIGH (ref 0–99)
Triglycerides: 152 mg/dL — ABNORMAL HIGH (ref 0–149)
VLDL Cholesterol Cal: 27 mg/dL (ref 5–40)

## 2020-05-26 LAB — CBC
Hematocrit: 46.7 % (ref 37.5–51.0)
Hemoglobin: 15.3 g/dL (ref 13.0–17.7)
MCH: 28.8 pg (ref 26.6–33.0)
MCHC: 32.8 g/dL (ref 31.5–35.7)
MCV: 88 fL (ref 79–97)
Platelets: 251 10*3/uL (ref 150–450)
RBC: 5.32 x10E6/uL (ref 4.14–5.80)
RDW: 12.1 % (ref 11.6–15.4)
WBC: 8.6 10*3/uL (ref 3.4–10.8)

## 2020-05-26 LAB — PSA: Prostate Specific Ag, Serum: 5.7 ng/mL — ABNORMAL HIGH (ref 0.0–4.0)

## 2020-08-12 ENCOUNTER — Other Ambulatory Visit: Payer: Self-pay | Admitting: Nurse Practitioner

## 2020-08-12 DIAGNOSIS — I10 Essential (primary) hypertension: Secondary | ICD-10-CM

## 2020-08-15 ENCOUNTER — Telehealth: Payer: Self-pay

## 2020-08-15 NOTE — Telephone Encounter (Signed)
I called and left pt vm to call the office he needs to call [facility] at [phone] to schedule an appt they have been trying to contact him. YL,RMA

## 2020-08-16 DIAGNOSIS — R69 Illness, unspecified: Secondary | ICD-10-CM | POA: Diagnosis not present

## 2021-01-18 DIAGNOSIS — R972 Elevated prostate specific antigen [PSA]: Secondary | ICD-10-CM | POA: Diagnosis not present

## 2021-02-13 ENCOUNTER — Other Ambulatory Visit: Payer: Self-pay | Admitting: Internal Medicine

## 2021-02-13 DIAGNOSIS — I10 Essential (primary) hypertension: Secondary | ICD-10-CM

## 2021-03-29 DIAGNOSIS — R972 Elevated prostate specific antigen [PSA]: Secondary | ICD-10-CM | POA: Diagnosis not present

## 2021-03-29 DIAGNOSIS — C61 Malignant neoplasm of prostate: Secondary | ICD-10-CM | POA: Diagnosis not present

## 2021-03-29 HISTORY — PX: OTHER SURGICAL HISTORY: SHX169

## 2021-04-10 DIAGNOSIS — C61 Malignant neoplasm of prostate: Secondary | ICD-10-CM | POA: Diagnosis not present

## 2021-04-20 ENCOUNTER — Other Ambulatory Visit (HOSPITAL_COMMUNITY): Payer: Self-pay | Admitting: Urology

## 2021-04-20 DIAGNOSIS — C61 Malignant neoplasm of prostate: Secondary | ICD-10-CM

## 2021-05-01 ENCOUNTER — Encounter (HOSPITAL_COMMUNITY)
Admission: RE | Admit: 2021-05-01 | Discharge: 2021-05-01 | Disposition: A | Discharge status: Home / Self Care | Payer: Medicare HMO | Source: Ambulatory Visit | Attending: Urology | Admitting: Urology

## 2021-05-01 ENCOUNTER — Other Ambulatory Visit: Payer: Self-pay

## 2021-05-01 DIAGNOSIS — R911 Solitary pulmonary nodule: Secondary | ICD-10-CM | POA: Diagnosis not present

## 2021-05-01 DIAGNOSIS — C61 Malignant neoplasm of prostate: Secondary | ICD-10-CM | POA: Insufficient documentation

## 2021-05-01 MED ORDER — PIFLIFOLASTAT F 18 (PYLARIFY) INJECTION
9.0000 | Freq: Once | INTRAVENOUS | Status: AC
  Administered 2021-05-01: 14:00:00 8.34 via INTRAVENOUS

## 2021-05-17 DIAGNOSIS — C61 Malignant neoplasm of prostate: Secondary | ICD-10-CM | POA: Insufficient documentation

## 2021-05-17 NOTE — Progress Notes (Signed)
Radiation Oncology         [phone] ________________________________  Initial Outpatient Consultation  Name: Christopher Watson MRN: [id number]  Date: 05/29/2021  DOB: [birth year]  CC:Christopher Brine, FNP  Christopher Aloe, MD   REFERRING PHYSICIAN: Festus Aloe, MD  DIAGNOSIS: 56 y.o. gentleman with Stage T2c adenocarcinoma of the prostate with Gleason score of 4+4, and PSA of 5.83.    ICD-10-CM   1. Malignant neoplasm of prostate ([place])  C61       HISTORY OF PRESENT ILLNESS: Christopher Watson is a 56 y.o. male with a family history of prostate cancer in his dad at age 63.  He has a mental disability and lives with his Dad, and relies on his brother Christopher Watson for support.  He was noted to have an elevated PSA of 5.1 in Jan 2021 by his primary care physician assistant Christopher Watson.  Repeat PSA  in Jan 2022 was 5.7.  Accordingly, he was referred for evaluation in urology by Dr. Junious Watson on 01/18/21,  digital rectal examination was performed at that time revealing a 30 gm gland with a subtle left nodule.  The patient proceeded to transrectal ultrasound with 12 biopsies of the prostate on 03/29/21.  The prostate volume measured 14.43 cc.  Out of 12 core biopsies, 10 were positive.  The maximum Gleason score was 4+4, and this was seen in the right lateral mid.   PSMA PET scan on 05/01/21 showed focal activity in the RIGHT lobe of the prostate gland consistent with prostate carcinoma.  There was no evidence metastatic adenopathy in the pelvis or periaortic retroperitoneum.  The patient reviewed the biopsy results with his urologist and he has kindly been referred today for discussion of potential radiation treatment options.   PREVIOUS RADIATION THERAPY: No  PAST MEDICAL HISTORY:  Past Medical History:  Diagnosis Date   Hypertension    Mental disability       PAST SURGICAL HISTORY: Past Surgical History:  Procedure Laterality Date   COLONOSCOPY     Prostate Needle Biopsy   03/29/2021    FAMILY HISTORY:  Family History  Problem Relation Age of Onset   Hypertension Father    Prostate cancer Father     SOCIAL HISTORY:  Social History   Socioeconomic History   Marital status: Single    Spouse name: Not on file   Number of children: Not on file   Years of education: Not on file   Highest education level: Not on file  Occupational History   Occupation: unemployment  Tobacco Use   Smoking status: Never   Smokeless tobacco: Never  Vaping Use   Vaping Use: Never used  Substance and Sexual Activity   Alcohol use: No   Drug use: Not Currently   Sexual activity: Not Currently  Other Topics Concern   Not on file  Social History Narrative   Not on file   Social Determinants of Health   Financial Resource Strain: Not on file  Food Insecurity: Not on file  Transportation Needs: Not on file  Physical Activity: Not on file  Stress: Not on file  Social Connections: Not on file  Intimate Partner Violence: Not on file    ALLERGIES: Bee venom  MEDICATIONS:  Current Outpatient Medications  Medication Sig Dispense Refill   olmesartan (BENICAR) 20 MG tablet TAKE 1 TABLET(20 MG) BY MOUTH DAILY 90 tablet 1   No current facility-administered medications for this encounter.    REVIEW OF SYSTEMS:  On review  of systems, the patient reports that he is doing well overall. He denies any chest pain, shortness of breath, cough, fevers, chills, night sweats, unintended weight changes. He denies any bowel disturbances, and denies abdominal pain, nausea or vomiting. He denies any new musculoskeletal or joint aches or pains. His IPSS was Total Score: 11, indicating moderate urinary symptoms. His is not sexually active and did not complete the SHIM questionaire. A complete review of systems is obtained and is otherwise negative.   PHYSICAL EXAM:  Wt Readings from Last 3 Encounters:  05/29/21 167 lb 6 oz (75.9 kg)  05/25/20 168 lb 6.4 oz (76.4 kg)  05/25/20 168 lb  (76.2 kg)   Temp Readings from Last 3 Encounters:  05/29/21 (!) 96.5 F (35.8 C) (Temporal)  05/25/20 98.2 F (36.8 C) (Oral)  05/25/20 98.2 F (36.8 C) (Oral)   BP Readings from Last 3 Encounters:  05/29/21 136/84  05/25/20 126/80  05/25/20 (!) 142/80   Pulse Readings from Last 3 Encounters:  05/29/21 89  05/25/20 87  05/25/20 (!) 105   Pain Assessment Pain Score: 0-No pain/10  In general this is a well appearing gentleman in no acute distress. He's alert and oriented x4 and appropriate throughout the examination. Cardiopulmonary assessment is negative for acute distress, and he exhibits normal effort.    KPS = 100  100 - Normal; no complaints; no evidence of disease. 90   - Able to carry on normal activity; minor signs or symptoms of disease. 80   - Normal activity with effort; some signs or symptoms of disease. 62   - Cares for self; unable to carry on normal activity or to do active work. 60   - Requires occasional assistance, but is able to care for most of his personal needs. 50   - Requires considerable assistance and frequent medical care. 36   - Disabled; requires special care and assistance. 31   - Severely disabled; hospital admission is indicated although death not imminent. 41   - Very sick; hospital admission necessary; active supportive treatment necessary. 10   - Moribund; fatal processes progressing rapidly. 0     - Dead  Karnofsky DA, Abelmann [place], Craver LS and Burchenal [facility] [phone]) The use of the nitrogen mustards in the palliative treatment of carcinoma: with particular reference to bronchogenic carcinoma Cancer 1 634-56  LABORATORY DATA:  Lab Results  Component Value Date   WBC 8.6 05/25/2020   HGB 15.3 05/25/2020   HCT 46.7 05/25/2020   MCV 88 05/25/2020   PLT 251 05/25/2020   Lab Results  Component Value Date   NA 138 05/25/2020   K 4.2 05/25/2020   CL 98 05/25/2020   CO2 26 05/25/2020   Lab Results  Component Value Date   ALT 8  05/25/2020   AST 14 05/25/2020   ALKPHOS 91 05/25/2020   BILITOT 0.5 05/25/2020     RADIOGRAPHY: NM PET (PSMA) SKULL TO MID THIGH  Result Date: 05/01/2021 CLINICAL DATA:  Prostate carcinoma with biochemical recurrence. PSA equal 5.8 EXAM: NUCLEAR MEDICINE PET SKULL BASE TO THIGH TECHNIQUE: 8.3 mCi F18 Piflufolastat (Pylarify) was injected intravenously. Full-ring PET imaging was performed from the skull base to thigh after the radiotracer. CT data was obtained and used for attenuation correction and anatomic localization. COMPARISON:  None. FINDINGS: NECK No radiotracer activity in neck lymph nodes. Incidental CT finding: None CHEST No radiotracer accumulation within mediastinal or hilar lymph nodes. No suspicious pulmonary nodules on the CT scan. Incidental  CT finding: Linear nodule along the RIGHT oblique fissure measuring 9 mm (97/4) benign morphology. No radiotracer activity ABDOMEN/PELVIS Prostate: Focal activity in the RIGHT lobe of the prostate gland with SUV max equal 6.4. Moderate size lesion measuring approximately 1.5 cm Lymph nodes: No abnormal radiotracer accumulation within pelvic or abdominal nodes. Liver: No evidence of liver metastasis Incidental CT finding: None SKELETON No focal activity to suggest skeletal metastasis. No blastic lesion IMPRESSION: 1. Focal activity RIGHT lobe of the prostate gland consistent with prostate carcinoma. 2. No evidence metastatic adenopathy in the pelvis or periaortic retroperitoneum. 3. No evidence of visceral metastasis or skeletal metastasis. 4. RIGHT lung nodule appears benign. Electronically Signed   By: Suzy Bouchard M.D.   On: 05/01/2021 16:56      IMPRESSION/PLAN: 1. 56 y.o. gentleman with Stage T2c adenocarcinoma of the prostate with Gleason score of 4+4, and PSA of 5.83. We discussed the patient's workup and outlined the nature of prostate cancer in this setting. The patient's T stage, Gleason's score, and PSA put him into the high risk  group. Accordingly, he is eligible for a variety of potential treatment options including ADT in combination with 8 weeks of external radiation or prostatectomy. We discussed the available radiation techniques, and focused on the details and logistics of delivery. The patient may not be an ideal candidate for brachytherapy boost with a prostate volume of less than 15 cc prior to downsizing from hormone therapy. We discussed and outlined the risks, benefits, short and long-term effects associated with radiotherapy and compared and contrasted these with prostatectomy. We discussed the role of SpaceOAR gel in reducing the rectal toxicity associated with radiotherapy. We also detailed the role of ADT in the treatment of high risk prostate cancer and outlined the associated side effects that could be expected with this therapy.  He appears to have a good understanding of his disease and our treatment recommendations which are of curative intent.  He was encouraged to ask questions that were answered to his stated satisfaction.  At the conclusion of our conversation, the patient is interested in moving forward with Long Term ADT with IMRT.  We personally spent 60 minutes in this encounter including chart review, reviewing radiological studies, meeting face-to-face with the patient, entering orders and completing documentation.      Tyler Pita, MD  [facility]   Radiation Oncology Direct Dial: [phone]   Fax: [phone] [email or web address]   Skype   LinkedIn

## 2021-05-22 NOTE — Progress Notes (Addendum)
GU Location of Tumor / Histology: Prostate Ca  If Prostate Cancer, Gleason Score is (4 + 4) and PSA is (5.83 as of 11/22)  Biopsies: Dr. Junious Silk       Past/Anticipated interventions by urology, if any:   Weight changes, if any:  No stable   IPSS:  11 SHIM:   NA (Mentally challenged brother can't answer)   Bowel/Bladder complaints, if any:  No bladder or bowels.  Nausea/Vomiting, if any: No  Pain issues, if any:  0/10 SAFETY ISSUES: Prior radiation?  No Pacemaker/ICD?  No Possible current pregnancy? Male Is the patient on methotrexate?  No  Current Complaints / other details:  Need more information on treatment options.  Brother is with him for visit.

## 2021-05-24 ENCOUNTER — Encounter: Payer: Self-pay | Admitting: Radiation Oncology

## 2021-05-29 ENCOUNTER — Other Ambulatory Visit: Payer: Self-pay

## 2021-05-29 ENCOUNTER — Ambulatory Visit: Payer: Medicare Other

## 2021-05-29 ENCOUNTER — Ambulatory Visit
Admission: RE | Admit: 2021-05-29 | Discharge: 2021-05-29 | Disposition: A | Discharge status: Home / Self Care | Payer: Medicare HMO | Source: Ambulatory Visit | Attending: Radiation Oncology | Admitting: Radiation Oncology

## 2021-05-29 ENCOUNTER — Ambulatory Visit: Payer: Medicare Other | Admitting: Radiation Oncology

## 2021-05-29 VITALS — BP 136/84 | HR 89 | Temp 96.5°F | Resp 18 | Ht 68.0 in | Wt 167.4 lb

## 2021-05-29 DIAGNOSIS — C61 Malignant neoplasm of prostate: Secondary | ICD-10-CM | POA: Insufficient documentation

## 2021-05-29 DIAGNOSIS — I10 Essential (primary) hypertension: Secondary | ICD-10-CM | POA: Diagnosis not present

## 2021-05-29 NOTE — Progress Notes (Signed)
Introduced myself to the patient, and patient's brother, as the prostate nurse navigator.  No barriers to care identified at this time.  He is here to discuss his radiation treatment options.  I gave him, and his brother, my business card and asked him to call me with questions or concerns.  Verbalized understanding.

## 2021-05-30 ENCOUNTER — Encounter: Payer: Self-pay | Admitting: Nurse Practitioner

## 2021-05-30 ENCOUNTER — Ambulatory Visit (INDEPENDENT_AMBULATORY_CARE_PROVIDER_SITE_OTHER): Payer: Medicare HMO | Admitting: Nurse Practitioner

## 2021-05-30 VITALS — BP 120/78 | HR 91 | Temp 98.3°F | Ht 68.0 in | Wt 170.6 lb

## 2021-05-30 DIAGNOSIS — C61 Malignant neoplasm of prostate: Secondary | ICD-10-CM | POA: Diagnosis not present

## 2021-05-30 DIAGNOSIS — Z0000 Encounter for general adult medical examination without abnormal findings: Secondary | ICD-10-CM | POA: Diagnosis not present

## 2021-05-30 DIAGNOSIS — I10 Essential (primary) hypertension: Secondary | ICD-10-CM

## 2021-05-30 DIAGNOSIS — R9431 Abnormal electrocardiogram [ECG] [EKG]: Secondary | ICD-10-CM

## 2021-05-30 DIAGNOSIS — Z79899 Other long term (current) drug therapy: Secondary | ICD-10-CM | POA: Diagnosis not present

## 2021-05-30 LAB — CMP14+EGFR
ALT: 10 IU/L (ref 0–44)
AST: 16 IU/L (ref 0–40)
Albumin/Globulin Ratio: 1.8 (ref 1.2–2.2)
Albumin: 4.6 g/dL (ref 3.8–4.9)
Alkaline Phosphatase: 79 IU/L (ref 44–121)
BUN/Creatinine Ratio: 15 (ref 9–20)
BUN: 14 mg/dL (ref 6–24)
Bilirubin Total: 0.4 mg/dL (ref 0.0–1.2)
CO2: 26 mmol/L (ref 20–29)
Calcium: 9.7 mg/dL (ref 8.7–10.2)
Chloride: 103 mmol/L (ref 96–106)
Creatinine, Ser: 0.91 mg/dL (ref 0.76–1.27)
Globulin, Total: 2.5 g/dL (ref 1.5–4.5)
Glucose: 90 mg/dL (ref 70–99)
Potassium: 4.6 mmol/L (ref 3.5–5.2)
Sodium: 140 mmol/L (ref 134–144)
Total Protein: 7.1 g/dL (ref 6.0–8.5)
eGFR: 100 mL/min/{1.73_m2} (ref >59)

## 2021-05-30 LAB — POCT URINALYSIS DIPSTICK
Bilirubin, UA: NEGATIVE
Blood, UA: NEGATIVE
Glucose, UA: NEGATIVE
Leukocytes, UA: NEGATIVE
Nitrite, UA: NEGATIVE
Protein, UA: NEGATIVE
Spec Grav, UA: 1.025 (ref 1.010–1.025)
Urobilinogen, UA: 1 E.U./dL
pH, UA: 6 (ref 5.0–8.0)

## 2021-05-30 LAB — CBC
Hematocrit: 44.9 % (ref 37.5–51.0)
Hemoglobin: 15.2 g/dL (ref 13.0–17.7)
MCH: 29.8 pg (ref 26.6–33.0)
MCHC: 33.9 g/dL (ref 31.5–35.7)
MCV: 88 fL (ref 79–97)
Platelets: 221 10*3/uL (ref 150–450)
RBC: 5.1 x10E6/uL (ref 4.14–5.80)
RDW: 11.9 % (ref 11.6–15.4)
WBC: 6 10*3/uL (ref 3.4–10.8)

## 2021-05-30 LAB — LIPID PANEL
Chol/HDL Ratio: 3.8 ratio (ref 0.0–5.0)
Cholesterol, Total: 170 mg/dL (ref 100–199)
HDL: 45 mg/dL (ref >39)
LDL Chol Calc (NIH): 108 mg/dL — ABNORMAL HIGH (ref 0–99)
Triglycerides: 93 mg/dL (ref 0–149)
VLDL Cholesterol Cal: 17 mg/dL (ref 5–40)

## 2021-05-30 NOTE — Progress Notes (Signed)
°I,Christopher Watson,acting as a scribe for Christopher Moore, FNP.,have documented all relevant documentation on the behalf of Christopher Moore, FNP,as directed by  Christopher Moore, FNP while in the presence of Christopher Moore, FNP.  ° °This visit occurred during the SARS-CoV-2 public health emergency.  Safety protocols were in place, including screening questions prior to the visit, additional usage of staff PPE, and extensive cleaning of exam room while observing appropriate contact time as indicated for disinfecting solutions. ° °Subjective:  °  ° Patient ID: Christopher Watson , male    DOB: 12/05/1965 , 55 y.o.   MRN: [id number] ° ° °Chief Complaint  °Patient presents with  ° Annual Exam  ° ° °HPI ° °Pt here for HM. He has no questions or concerns at this time. Newly diagnosed with prostate cancer in 2022.  ° °Hypertension °This is a chronic problem. The current episode started more than 1 year ago. The problem is controlled. Pertinent negatives include no anxiety, chest pain, headaches, palpitations or shortness of breath. There are no associated agents to hypertension. Risk factors for coronary artery disease include sedentary lifestyle and male gender. Past treatments include angiotensin blockers. There are no compliance problems.  There is no history of angina or kidney disease. There is no history of chronic renal disease.   ° °Past Medical History:  °Diagnosis Date  ° Hypertension   ° Mental disability   °  ° °Family History  °Problem Relation Age of Onset  ° Hypertension Father   ° Prostate cancer Father   ° ° ° °Current Outpatient Medications:  °  olmesartan (BENICAR) 20 MG tablet, TAKE 1 TABLET(20 MG) BY MOUTH DAILY, Disp: 90 tablet, Rfl: 1  ° °Allergies  °Allergen Reactions  ° Bee Venom Swelling  °  ° °Men's preventive visit. Patient Health Questionnaire (PHQ-2) is  °Flowsheet Row Office Visit from 05/30/2021 in [facility]  °PHQ-2 Total Score 0  ° °  ° °Patient is on a Regular diet; exercises  with walking. Marital status: Single. Relevant history for alcohol use is:  °Social History  ° °Substance and Sexual Activity  °Alcohol Use No  ° °Relevant history for tobacco use is:  °Social History  ° °Tobacco Use  °Smoking Status Never  °Smokeless Tobacco Never  °.  ° °Review of Systems  °Constitutional: Negative.   °HENT: Negative.    °Eyes: Negative.   °Respiratory: Negative.  Negative for shortness of breath and wheezing.   °Cardiovascular: Negative.  Negative for chest pain, palpitations and leg swelling.  °Gastrointestinal: Negative.   °Endocrine: Negative.   °Genitourinary: Negative.   °Musculoskeletal: Negative.   °Skin: Negative.   °Allergic/Immunologic: Negative.   °Neurological: Negative.  Negative for dizziness and headaches.  °Hematological: Negative.   °Psychiatric/Behavioral: Negative.     ° °Today's Vitals  ° 05/30/21 0956  °BP: 120/78  °Pulse: 91  °Temp: 98.3 °F (36.8 °C)  °Weight: 170 lb 9.6 oz (77.4 kg)  °Height: 5' 8" (1.727 m)  ° °Body mass index is 25.94 kg/m².  °Wt Readings from Last 3 Encounters:  °05/30/21 170 lb 9.6 oz (77.4 kg)  °05/29/21 167 lb 6 oz (75.9 kg)  °05/25/20 168 lb 6.4 oz (76.4 kg)  °  °Objective:  °Physical Exam °Vitals reviewed.  °Constitutional:   °   General: He is not in acute distress. °   Appearance: Normal appearance.  °HENT:  °   Head: Normocephalic and atraumatic.  °   Right Ear: Tympanic membrane, ear canal   canal and external ear normal. There is no impacted cerumen.     Left Ear: Tympanic membrane, ear canal and external ear normal. There is no impacted cerumen.     Nose:     Comments: Deferred - masked    Mouth/Throat:     Comments: Deferred - masked Eyes:     Pupils: Pupils are equal, round, and reactive to light.  Cardiovascular:     Rate and Rhythm: Normal rate and regular rhythm.     Pulses: Normal pulses.     Heart sounds: Normal heart sounds. No murmur heard. Pulmonary:     Effort: Pulmonary effort is normal. No respiratory distress.     Breath  sounds: Normal breath sounds. No wheezing.  Abdominal:     General: Abdomen is flat. Bowel sounds are normal. There is no distension.     Palpations: Abdomen is soft. There is no mass.     Tenderness: There is no abdominal tenderness.  Genitourinary:    Comments: Being followed by Urology and had radiation for prostate Musculoskeletal:        General: No swelling or tenderness. Normal range of motion.     Cervical back: Normal range of motion and neck supple.  Skin:    General: Skin is warm and dry.     Capillary Refill: Capillary refill takes less than 2 seconds.  Neurological:     General: No focal deficit present.     Mental Status: He is alert and oriented to person, place, and time.     Cranial Nerves: No cranial nerve deficit.     Motor: No weakness.  Psychiatric:        Mood and Affect: Mood normal.        Behavior: Behavior normal.        Thought Content: Thought content normal.        Judgment: Judgment normal.     Comments: He has a history of Autism        Assessment And Plan:    1. Encounter for general adult medical examination w/o abnormal findings Behavior modifications discussed and diet history reviewed.   Pt will continue to exercise regularly and modify diet with low GI, plant based foods and decrease intake of processed foods.  Recommend intake of daily multivitamin, Vitamin D, and calcium.  Recommend mammogram and colonoscopy for preventive screenings, as well as recommend immunizations that include influenza, TDAP, and Shingles (declined at this time - his dad will discuss with his brother Christopher Watson) - Lipid panel - CMP14+EGFR  2. Essential hypertension Blood pressure is normal. Continue current medications - POCT Urinalysis Dipstick (81002) - EKG 12-Lead - Microalbumin / creatinine urine ratio - CMP14+EGFR  3. Adenocarcinoma of prostate [facility]) He is being treated with Oncology (radiation) His brother Christopher Watson is taking him to his appts.   4. Abnormal  EKG Right atrial enlargement on EKG, will check echo - ECHOCARDIOGRAM COMPLETE; Future  5. Other long term (current) drug therapy - CBC     Patient was given opportunity to ask questions. Patient verbalized understanding of the plan and was able to repeat key elements of the plan. All questions were answered to their satisfaction.   [name], FNP   I, [name], FNP, have reviewed all documentation for this visit. The documentation on 05/30/21 for the exam, diagnosis, procedures, and orders are all accurate and complete.   THE PATIENT IS ENCOURAGED TO PRACTICE SOCIAL DISTANCING DUE TO THE COVID-19 PANDEMIC.

## 2021-05-30 NOTE — Progress Notes (Signed)
Pt is set up with [facility] to start ADT on 1/25.  Pt's brother, Will, is aware of appointment time and date.

## 2021-05-30 NOTE — Patient Instructions (Signed)

## 2021-05-31 LAB — MICROALBUMIN / CREATININE URINE RATIO
Creatinine, Urine: 191.2 mg/dL
Microalb/Creat Ratio: 3 mg/g creat (ref 0–29)
Microalbumin, Urine: 5.1 ug/mL

## 2021-06-06 ENCOUNTER — Ambulatory Visit: Payer: Medicare HMO

## 2021-06-07 ENCOUNTER — Telehealth: Payer: Self-pay

## 2021-06-07 DIAGNOSIS — C61 Malignant neoplasm of prostate: Secondary | ICD-10-CM | POA: Diagnosis not present

## 2021-06-07 DIAGNOSIS — Z5111 Encounter for antineoplastic chemotherapy: Secondary | ICD-10-CM | POA: Diagnosis not present

## 2021-06-07 NOTE — Telephone Encounter (Signed)
This nurse called patient in regards to missed [facility] appointment. Brother Will states that he attempted to call office to cancel or reschedule due to a scheduling conflict. States that he will call back to reschedule.

## 2021-06-13 ENCOUNTER — Telehealth: Payer: Self-pay | Admitting: Nurse Practitioner

## 2021-06-13 NOTE — Telephone Encounter (Signed)
Left message reminding pt of in office appt

## 2021-06-14 ENCOUNTER — Ambulatory Visit (INDEPENDENT_AMBULATORY_CARE_PROVIDER_SITE_OTHER): Payer: Medicare HMO

## 2021-06-14 ENCOUNTER — Other Ambulatory Visit: Payer: Self-pay

## 2021-06-14 VITALS — BP 138/80 | HR 88 | Temp 97.5°F | Ht 68.8 in | Wt 171.4 lb

## 2021-06-14 DIAGNOSIS — Z0000 Encounter for general adult medical examination without abnormal findings: Secondary | ICD-10-CM

## 2021-06-14 NOTE — Progress Notes (Signed)
This visit occurred during the SARS-CoV-2 public health emergency.  Safety protocols were in place, including screening questions prior to the visit, additional usage of staff PPE, and extensive cleaning of exam room while observing appropriate contact time as indicated for disinfecting solutions.  Subjective:   Christopher Watson is a 56 y.o. male who presents for Medicare Annual/Subsequent preventive examination.  Review of Systems     Cardiac Risk Factors include: advanced age (>79men, >47 women);male gender     Objective:    Today's Vitals   06/14/21 1433  BP: 138/80  Pulse: 88  Temp: (!) 97.5 F (36.4 C)  TempSrc: Oral  SpO2: 98%  Weight: 171 lb 6.4 oz (77.7 kg)  Height: 5' 8.8" (1.748 m)   Body mass index is 25.46 kg/m.  Advanced Directives 06/14/2021 05/29/2021 05/25/2020 05/21/2019 12/02/2018 01/26/2015  Does Patient Have a Medical Advance Directive? Yes No No No No No  Type of Advance Directive [place] in Chart? No - copy requested - - - - -  Would patient like information on creating a medical advance directive? - - No - Patient declined - No - Patient declined No - patient declined information    Current Medications (verified) Outpatient Encounter Medications as of 06/14/2021  Medication Sig   olmesartan (BENICAR) 20 MG tablet TAKE 1 TABLET(20 MG) BY MOUTH DAILY   No facility-administered encounter medications on file as of 06/14/2021.    Allergies (verified) Bee venom   History: Past Medical History:  Diagnosis Date   Hypertension    Mental disability    Past Surgical History:  Procedure Laterality Date   COLONOSCOPY     Prostate Needle Biopsy  03/29/2021   Family History  Problem Relation Age of Onset   Hypertension Father    Prostate cancer Father    Social History   Socioeconomic History   Marital status: Single    Spouse name: Not on file   Number of children: Not on file   Years  of education: Not on file   Highest education level: Not on file  Occupational History   Occupation: unemployment  Tobacco Use   Smoking status: Never   Smokeless tobacco: Never  Vaping Use   Vaping Use: Never used  Substance and Sexual Activity   Alcohol use: No   Drug use: Not Currently   Sexual activity: Not Currently  Other Topics Concern   Not on file  Social History Narrative   Not on file   Social Determinants of Health   Financial Resource Strain: Low Risk    Difficulty of Paying Living Expenses: Not hard at all  Food Insecurity: No Food Insecurity   Worried About Charity fundraiser in the Last Year: Never true   [place] in the Last Year: Never true  Transportation Needs: No Transportation Needs   Lack of Transportation (Medical): No   Lack of Transportation (Non-Medical): No  Physical Activity: Inactive   Days of Exercise per Week: 0 days   Minutes of Exercise per Session: 0 min  Stress: No Stress Concern Present   Feeling of Stress : Not at all  Social Connections: Not on file    Tobacco Counseling Counseling given: Not Answered   Clinical Intake:  Pre-visit preparation completed: Yes  Pain : No/denies pain     Nutritional Status: BMI 25 -29 Overweight Nutritional Risks: None Diabetes: No  How often  do you need to have someone help you when you read instructions, pamphlets, or other written materials from your doctor or pharmacy?: 3 - Sometimes  Diabetic? no  Interpreter Needed?: No  Information entered by :: NAllen LPN   Activities of Daily Living In your present state of health, do you have any difficulty performing the following activities: 06/14/2021  Hearing? N  Vision? N  Difficulty concentrating or making decisions? Y  Walking or climbing stairs? N  Dressing or bathing? N  Doing errands, shopping? N  Preparing Food and eating ? N  Using the Toilet? N  In the past six months, have you accidently leaked urine? N  Do you  have problems with loss of bowel control? N  Managing your Medications? Y  Managing your Finances? Y  Housekeeping or managing your Housekeeping? N  Some recent data might be hidden    Patient Care Team: [name], FNP as PCP - General (General Practice) Rex Kras, Claudette Stapler, RN as [place] Management Katheren Puller, RN as Oncology Nurse Navigator  Indicate any recent Medical Services you may have received from other than [facility] providers in the past year (date may be approximate).     Assessment:   This is a routine wellness examination for Christopher Watson.  Hearing/Vision screen Vision Screening - Comments:: No regular eye exams,  Dietary issues and exercise activities discussed: Current Exercise Habits: The patient does not participate in regular exercise at present   Goals Addressed             This Visit's Progress    Patient Stated       06/14/2021, no goals       Depression Screen PHQ 2/9 Scores 06/14/2021 05/30/2021 05/25/2020 05/25/2020 05/21/2019 05/21/2019 12/02/2018  PHQ - 2 Score 0 0 0 0 0 0 0  PHQ- 9 Score - - - - 0 - 0    Fall Risk Fall Risk  06/14/2021 05/25/2020 05/21/2019 05/21/2019 12/02/2018  Falls in the past year? 0 0 0 0 0  Number falls in past yr: - - - - 0  Risk for fall due to : Medication side effect Medication side effect Medication side effect - Medication side effect  Follow up Falls evaluation completed;Education provided;Falls prevention discussed Falls evaluation completed;Education provided;Falls prevention discussed Falls evaluation completed;Education provided;Falls prevention discussed - Falls evaluation completed;Education provided;Falls prevention discussed    FALL RISK PREVENTION PERTAINING TO THE HOME:  Any stairs in or around the home? Yes  If so, are there any without handrails? No  Home free of loose throw rugs in walkways, pet beds, electrical cords, etc? Yes  Adequate lighting in your home to reduce risk of falls? Yes   ASSISTIVE  DEVICES UTILIZED TO PREVENT FALLS:  Life alert? No  Use of a cane, walker or w/c? No  Grab bars in the bathroom? No  Shower chair or bench in shower? No  Elevated toilet seat or a handicapped toilet? No   TIMED UP AND GO:  Was the test performed? No .    Gait steady and fast without use of assistive device  Cognitive Function:     6CIT Screen 12/02/2018  What Year? 4 points  What month? 3 points  What time? 3 points  Count back from 20 4 points  Months in reverse 4 points  Repeat phrase 10 points  Total Score 28    Immunizations  There is no immunization history on file for this patient.  TDAP status: Due,  Education has been provided regarding the importance of this vaccine. Advised may receive this vaccine at local pharmacy or Health Dept. Aware to provide a copy of the vaccination record if obtained from local pharmacy or Health Dept. Verbalized acceptance and understanding.  Flu Vaccine status: Declined, Education has been provided regarding the importance of this vaccine but patient still declined. Advised may receive this vaccine at local pharmacy or Health Dept. Aware to provide a copy of the vaccination record if obtained from local pharmacy or Health Dept. Verbalized acceptance and understanding.  Pneumococcal vaccine status: Declined,  Education has been provided regarding the importance of this vaccine but patient still declined. Advised may receive this vaccine at local pharmacy or Health Dept. Aware to provide a copy of the vaccination record if obtained from local pharmacy or Health Dept. Verbalized acceptance and understanding.   Covid-19 vaccine status: Declined, Education has been provided regarding the importance of this vaccine but patient still declined. Advised may receive this vaccine at local pharmacy or Health Dept.or vaccine clinic. Aware to provide a copy of the vaccination record if obtained from local pharmacy or Health Dept. Verbalized acceptance and  understanding.  Qualifies for Shingles Vaccine? Yes   Zostavax completed No   Shingrix Completed?: No.    Education has been provided regarding the importance of this vaccine. Patient has been advised to call insurance company to determine out of pocket expense if they have not yet received this vaccine. Advised may also receive vaccine at local pharmacy or Health Dept. Verbalized acceptance and understanding.  Screening Tests Health Maintenance  Topic Date Due   COVID-19 Vaccine (1) 06/30/2021 (Originally 08/29/1966)   INFLUENZA VACCINE  08/11/2021 (Originally 12/12/2020)   Zoster Vaccines- Shingrix (1 of 2) 08/28/2021 (Originally 02/27/1985)   TETANUS/TDAP  05/30/2022 (Originally 02/27/1985)   HIV Screening  05/30/2022 (Originally [birth year]27[birth year]1981)   COLONOSCOPY (Pts 45-63yrs Insurance coverage will need to be confirmed)  01/27/2030   Hepatitis C Screening  Completed   HPV VACCINES  Aged Out    Health Maintenance  There are no preventive care reminders to display for this patient.   Colorectal cancer screening: Type of screening: Colonoscopy. Completed 01/28/2020. Repeat every 10 years  Lung Cancer Screening: (Low Dose CT Chest recommended if Age 107-80 years, 30 pack-year currently smoking OR have quit w/in 15years.) does not qualify.   Lung Cancer Screening Referral: no  Additional Screening:  Hepatitis C Screening: does qualify; Completed 12/09/2019  Vision Screening: Recommended annual ophthalmology exams for early detection of glaucoma and other disorders of the eye. Is the patient up to date with their annual eye exam?  No  Who is the provider or what is the name of the office in which the patient attends annual eye exams? none If pt is not established with a provider, would they like to be referred to a provider to establish care? No .   Dental Screening: Recommended annual dental exams for proper oral hygiene  Community Resource Referral / Chronic Care Management: CRR required  this visit?  No   CCM required this visit?  No      Plan:     I have personally reviewed and noted the following in the patients chart:   Medical and social history Use of alcohol, tobacco or illicit drugs  Current medications and supplements including opioid prescriptions. Patient is not currently taking opioid prescriptions. Functional ability and status Nutritional status Physical activity Advanced directives List of other physicians Hospitalizations, surgeries, and ER visits in  previous 12 months Vitals Screenings to include cognitive, depression, and falls Referrals and appointments  In addition, I have reviewed and discussed with patient certain preventive protocols, quality metrics, and best practice recommendations. A written personalized care plan for preventive services as well as general preventive health recommendations were provided to patient.     Kellie Simmering, LPN   07/16/6699   Nurse Notes: 6 CIT not administered. Has cognitive impairments.

## 2021-06-14 NOTE — Patient Instructions (Signed)
Mr. Teems , Thank you for taking time to come for your Medicare Wellness Visit. I appreciate your ongoing commitment to your health goals. Please review the following plan we discussed and let me know if I can assist you in the future.   Screening recommendations/referrals: Colonoscopy: 01/28/2020 Recommended yearly ophthalmology/optometry visit for glaucoma screening and checkup Recommended yearly dental visit for hygiene and checkup  Vaccinations: Influenza vaccine: decline Pneumococcal vaccine: decline Tdap vaccine: decline Shingles vaccine: decline   Covid-19:  decline  Advanced directives: Advance directive discussed with you today. Even though you declined this today please call our office should you change your mind and we can give you the proper paperwork for you to fill out.   Conditions/risks identified: none  Next appointment: Follow up in one year for your annual wellness visit   Preventive Care 40-64 Years, Male Preventive care refers to lifestyle choices and visits with your health care provider that can promote health and wellness. What does preventive care include? A yearly physical exam. This is also called an annual well check. Dental exams once or twice a year. Routine eye exams. Ask your health care provider how often you should have your eyes checked. Personal lifestyle choices, including: Daily care of your teeth and gums. Regular physical activity. Eating a healthy diet. Avoiding tobacco and drug use. Limiting alcohol use. Practicing safe sex. Taking low-dose aspirin every day starting at age 57. What happens during an annual well check? The services and screenings done by your health care provider during your annual well check will depend on your age, overall health, lifestyle risk factors, and family history of disease. Counseling  Your health care provider may ask you questions about your: Alcohol use. Tobacco use. Drug use. Emotional  well-being. Home and relationship well-being. Sexual activity. Eating habits. Work and work Statistician. Screening  You may have the following tests or measurements: Height, weight, and BMI. Blood pressure. Lipid and cholesterol levels. These may be checked every 5 years, or more frequently if you are over 25 years old. Skin check. Lung cancer screening. You may have this screening every year starting at age 82 if you have a 30-pack-year history of smoking and currently smoke or have quit within the past 15 years. Fecal occult blood test (FOBT) of the stool. You may have this test every year starting at age 48. Flexible sigmoidoscopy or colonoscopy. You may have a sigmoidoscopy every 5 years or a colonoscopy every 10 years starting at age 37. Prostate cancer screening. Recommendations will vary depending on your family history and other risks. Hepatitis C blood test. Hepatitis B blood test. Sexually transmitted disease (STD) testing. Diabetes screening. This is done by checking your blood sugar (glucose) after you have not eaten for a while (fasting). You may have this done every 1-3 years. Discuss your test results, treatment options, and if necessary, the need for more tests with your health care provider. Vaccines  Your health care provider may recommend certain vaccines, such as: Influenza vaccine. This is recommended every year. Tetanus, diphtheria, and acellular pertussis (Tdap, Td) vaccine. You may need a Td booster every 10 years. Zoster vaccine. You may need this after age 75. Pneumococcal 13-valent conjugate (PCV13) vaccine. You may need this if you have certain conditions and have not been vaccinated. Pneumococcal polysaccharide (PPSV23) vaccine. You may need one or two doses if you smoke cigarettes or if you have certain conditions. Talk to your health care provider about which screenings and vaccines you need and  how often you need them. This information is not intended to  replace advice given to you by your health care provider. Make sure you discuss any questions you have with your health care provider. Document Released: 05/27/2015 Document Revised: 01/18/2016 Document Reviewed: 03/01/2015 Elsevier Interactive Patient Education  2017 [employer] Prevention in the Home Falls can cause injuries. They can happen to people of all ages. There are many things you can do to make your home safe and to help prevent falls. What can I do on the outside of my home? Regularly fix the edges of walkways and driveways and fix any cracks. Remove anything that might make you trip as you walk through a door, such as a raised step or threshold. Trim any bushes or trees on the path to your home. Use bright outdoor lighting. Clear any walking paths of anything that might make someone trip, such as rocks or tools. Regularly check to see if handrails are loose or broken. Make sure that both sides of any steps have handrails. Any raised decks and porches should have guardrails on the edges. Have any leaves, snow, or ice cleared regularly. Use sand or salt on walking paths during winter. Clean up any spills in your garage right away. This includes oil or grease spills. What can I do in the bathroom? Use night lights. Install grab bars by the toilet and in the tub and shower. Do not use towel bars as grab bars. Use non-skid mats or decals in the tub or shower. If you need to sit down in the shower, use a plastic, non-slip stool. Keep the floor dry. Clean up any water that spills on the floor as soon as it happens. Remove soap buildup in the tub or shower regularly. Attach bath mats securely with double-sided non-slip rug tape. Do not have throw rugs and other things on the floor that can make you trip. What can I do in the bedroom? Use night lights. Make sure that you have a light by your bed that is easy to reach. Do not use any sheets or blankets that are too big for  your bed. They should not hang down onto the floor. Have a firm chair that has side arms. You can use this for support while you get dressed. Do not have throw rugs and other things on the floor that can make you trip. What can I do in the kitchen? Clean up any spills right away. Avoid walking on wet floors. Keep items that you use a lot in easy-to-reach places. If you need to reach something above you, use a strong step stool that has a grab bar. Keep electrical cords out of the way. Do not use floor polish or wax that makes floors slippery. If you must use wax, use non-skid floor wax. Do not have throw rugs and other things on the floor that can make you trip. What can I do with my stairs? Do not leave any items on the stairs. Make sure that there are handrails on both sides of the stairs and use them. Fix handrails that are broken or loose. Make sure that handrails are as long as the stairways. Check any carpeting to make sure that it is firmly attached to the stairs. Fix any carpet that is loose or worn. Avoid having throw rugs at the top or bottom of the stairs. If you do have throw rugs, attach them to the floor with carpet tape. Make sure that you have  a light switch at the top of the stairs and the bottom of the stairs. If you do not have them, ask someone to add them for you. What else can I do to help prevent falls? Wear shoes that: Do not have high heels. Have rubber bottoms. Are comfortable and fit you well. Are closed at the toe. Do not wear sandals. If you use a stepladder: Make sure that it is fully opened. Do not climb a closed stepladder. Make sure that both sides of the stepladder are locked into place. Ask someone to hold it for you, if possible. Clearly mark and make sure that you can see: Any grab bars or handrails. First and last steps. Where the edge of each step is. Use tools that help you move around (mobility aids) if they are needed. These  include: Canes. Walkers. Scooters. Crutches. Turn on the lights when you go into a dark area. Replace any light bulbs as soon as they burn out. Set up your furniture so you have a clear path. Avoid moving your furniture around. If any of your floors are uneven, fix them. If there are any pets around you, be aware of where they are. Review your medicines with your doctor. Some medicines can make you feel dizzy. This can increase your chance of falling. Ask your doctor what other things that you can do to help prevent falls. This information is not intended to replace advice given to you by your health care provider. Make sure you discuss any questions you have with your health care provider. Document Released: 02/24/2009 Document Revised: 10/06/2015 Document Reviewed: 06/04/2014 Elsevier Interactive Patient Education  2017 [employer].

## 2021-06-19 ENCOUNTER — Other Ambulatory Visit: Payer: Self-pay | Admitting: Urology

## 2021-06-20 ENCOUNTER — Telehealth: Payer: Self-pay | Admitting: *Deleted

## 2021-06-20 NOTE — Telephone Encounter (Signed)
CALLED PATIENT TO INFORM OF FID. MARKER AND SPACE OAR GEL PLACEMENT ON 08-03-21 AND HIS SIM ON 08-07-21- ARRIVAL TIME- 9:45 AM @ [place], PATIENT TO ARRIVE WITH FULL BLADDER AND EMPTY BOWEL, SPOKE WITH PATIENT'S BROTHER - Christopher Watson AND HE IS AWARE OF THESE APPTS. AND INSTRUCTIONS

## 2021-06-23 ENCOUNTER — Ambulatory Visit (HOSPITAL_COMMUNITY): Payer: Medicare HMO | Attending: Cardiology

## 2021-06-23 ENCOUNTER — Other Ambulatory Visit: Payer: Self-pay

## 2021-06-23 DIAGNOSIS — R9431 Abnormal electrocardiogram [ECG] [EKG]: Secondary | ICD-10-CM | POA: Diagnosis not present

## 2021-06-23 LAB — ECHOCARDIOGRAM COMPLETE
Area-P 1/2: 5.2 cm2
P 1/2 time: 567 msec
S' Lateral: 2.3 cm

## 2021-06-29 NOTE — Progress Notes (Signed)
Pt chart reviewed, pt meets [facility] criteria barring any acute status changes.

## 2021-07-31 ENCOUNTER — Encounter (HOSPITAL_BASED_OUTPATIENT_CLINIC_OR_DEPARTMENT_OTHER): Payer: Self-pay | Admitting: Urology

## 2021-08-02 ENCOUNTER — Other Ambulatory Visit: Payer: Self-pay

## 2021-08-02 ENCOUNTER — Encounter (HOSPITAL_BASED_OUTPATIENT_CLINIC_OR_DEPARTMENT_OTHER): Payer: Self-pay | Admitting: Urology

## 2021-08-02 NOTE — H&P (Signed)
H&P ? ?Chief Complaint: Prostate cancer ? ?History of Present Illness: 56 yo male w/ GG 4 PCa presents for placement of SpaceOAR as well as fiducial markers in advance of ERBT. ? ?Past Medical History:  ?Diagnosis Date  ? Hypertension   ? Mental disability   ? stuttering, learning disability father is legal guardian  ? Prostate cancer ([place])   ? ? ?Past Surgical History:  ?Procedure Laterality Date  ? COLONOSCOPY  01/28/2020  ? Prostate Needle Biopsy  03/29/2021  ? ? ?Home Medications:  ?Allergies as of 08/02/2021   ? ?   Reactions  ? Bee Venom Swelling  ? ?  ? ?  ?Medication List  ?  ? ? Notice   ?Cannot display discharge medications because the patient has not yet been admitted. ?  ? ? ?Allergies:  ?Allergies  ?Allergen Reactions  ? Bee Venom Swelling  ? ? ?Family History  ?Problem Relation Age of Onset  ? Hypertension Father   ? Prostate cancer Father   ? ? ?Social History:  reports that he has never smoked. He has never used smokeless tobacco. He reports that he does not currently use drugs. He reports that he does not drink alcohol. ? ?ROS: ?A complete review of systems was performed.  All systems are negative except for pertinent findings as noted. ? ?Physical Exam:  ?Vital signs in last 24 hours: ?Ht '5\' 8"'$  (1.727 m)   Wt 75.8 kg   BMI 25.39 kg/m?  ?Constitutional:  Alert and oriented, No acute distress ?Cardiovascular: Regular rate  ?Respiratory: Normal respiratory effort ?Psychiatric: Normal mood and affect ? ?I have reviewed prior pt notes ? ?I have reviewed notes from referring/previous physicians ? ?I have reviewed prior PSA/ pathology results ? ? ? ?Impression/Assessment:  ?High risk PCa ? ?Plan:  ?Placement of fiducial markers/SpaceOAR ? ?

## 2021-08-02 NOTE — Progress Notes (Signed)
Spoke w/ via phone for pre-op interview---pt brother [name] per father alex poa request ?Lab needs dos---- ekg, istat              ?Lab results------echo 02-2022 epic ?COVID test -----patient states asymptomatic no test needed ?Arrive at -------630 am 08-03-2021 ?NPO after MN NO Solid Food.  Water from MN until---530 am ?Med rec completed ?Medications to take morning of surgery -----none, do not take bp medication ?Diabetic medication -----n/a ?Patient instructed no nail polish to be worn day of surgery ?Patient instructed to bring photo id and insurance card day of surgery ?Patient aware to have Driver (ride )  brother william/ caregiver  father alex and brother [name]    for 24 hours after surgery  ?Patient Special Instructions -----fleets enema 430 am morning of surgery ?Pre-Op special Istructions ----- ?Patient verbalized understanding of instructions that were given at this phone interview. ?Patient denies shortness of breath, chest pain, fever, cough at this phone interview.  ? ?Patient is learning disabled from birth stutters and has legal guardian father alex kirschenmann  who is 62 years old who lives with him, father alex lambertson has lost legal guardian papers done at social security office many years ago. Brother kamdin follett cell [phone] is driver and caregiver for patient and  will drive father alex to [place]  and needs to be in waiting room with father alex day of surgery. ?

## 2021-08-02 NOTE — Anesthesia Preprocedure Evaluation (Addendum)
Anesthesia Evaluation  ?Patient identified by MRN, date of birth, ID band ?Patient awake ? ?General Assessment Comment:Pt father answered Questions . ? ?Reviewed: ?Allergy & Precautions, NPO status , Patient's Chart, lab work & pertinent test results ? ?Airway ?Mallampati: II ? ?TM Distance: >3 FB ?Neck ROM: Full ? ? ? Dental ?no notable dental hx. ?(+) Teeth Intact, Dental Advisory Given,  ?  ?Pulmonary ? ?  ?Pulmonary exam normal ?breath sounds clear to auscultation ? ? ? ? ? ? Cardiovascular ?hypertension, Normal cardiovascular exam ?Rhythm:Regular Rate:Normal ? ? ?  ?Neuro/Psych ?  ? GI/Hepatic ?  ?Endo/Other  ?negative endocrine ROS ? Renal/GU ?  ? ?Prostate CA ? ?  ?Musculoskeletal ?negative musculoskeletal ROS ?(+)  ? Abdominal ?  ?Peds ? Hematology ?  ?Anesthesia Other Findings ? ? Reproductive/Obstetrics ? ?  ? ? ? ? ? ? ? ? ? ? ? ? ? ?  ?  ? ? ? ? ? ? ? ?Anesthesia Physical ?Anesthesia Plan ? ?ASA: 2 ? ?Anesthesia Plan: MAC  ? ?Post-op Pain Management:   ? ?Induction:  ? ?PONV Risk Score and Plan: 2 and Treatment may vary due to age or medical condition, Midazolam and Ondansetron ? ?Airway Management Planned: Natural Airway and Nasal Cannula ? ?Additional Equipment: None ? ?Intra-op Plan:  ? ?Post-operative Plan:  ? ?Informed Consent: I have reviewed the patients History and Physical, chart, labs and discussed the procedure including the risks, benefits and alternatives for the proposed anesthesia with the patient or authorized representative who has indicated his/her understanding and acceptance.  ? ? ? ?Dental advisory given and Consent reviewed with POA ? ?Plan Discussed with:  ? ?Anesthesia Plan Comments:   ? ? ? ? ? ?Anesthesia Quick Evaluation ? ?

## 2021-08-03 ENCOUNTER — Ambulatory Visit (HOSPITAL_BASED_OUTPATIENT_CLINIC_OR_DEPARTMENT_OTHER)
Admission: RE | Admit: 2021-08-03 | Discharge: 2021-08-03 | Disposition: A | Discharge status: Home / Self Care | Payer: Medicare HMO | Attending: Urology | Admitting: Urology

## 2021-08-03 ENCOUNTER — Ambulatory Visit (HOSPITAL_BASED_OUTPATIENT_CLINIC_OR_DEPARTMENT_OTHER): Payer: Medicare HMO | Admitting: Anesthesiology

## 2021-08-03 ENCOUNTER — Encounter (HOSPITAL_BASED_OUTPATIENT_CLINIC_OR_DEPARTMENT_OTHER)
Admission: RE | Disposition: A | Discharge status: Home / Self Care | Payer: Self-pay | Source: Home / Self Care | Attending: Urology

## 2021-08-03 ENCOUNTER — Encounter (HOSPITAL_BASED_OUTPATIENT_CLINIC_OR_DEPARTMENT_OTHER): Payer: Self-pay | Admitting: Urology

## 2021-08-03 DIAGNOSIS — C61 Malignant neoplasm of prostate: Secondary | ICD-10-CM | POA: Insufficient documentation

## 2021-08-03 DIAGNOSIS — Z8546 Personal history of malignant neoplasm of prostate: Secondary | ICD-10-CM | POA: Diagnosis not present

## 2021-08-03 DIAGNOSIS — I10 Essential (primary) hypertension: Secondary | ICD-10-CM

## 2021-08-03 HISTORY — PX: GOLD SEED IMPLANT: SHX6343

## 2021-08-03 HISTORY — PX: SPACE OAR INSTILLATION: SHX6769

## 2021-08-03 HISTORY — DX: Malignant neoplasm of prostate: C61

## 2021-08-03 LAB — POCT I-STAT, CHEM 8
BUN: 18 mg/dL (ref 6–20)
Calcium, Ion: 1.25 mmol/L (ref 1.15–1.40)
Chloride: 101 mmol/L (ref 98–111)
Creatinine, Ser: 0.8 mg/dL (ref 0.61–1.24)
Glucose, Bld: 87 mg/dL (ref 70–99)
HCT: 44 % (ref 39.0–52.0)
Hemoglobin: 15 g/dL (ref 13.0–17.0)
Potassium: 5.3 mmol/L — ABNORMAL HIGH (ref 3.5–5.1)
Sodium: 140 mmol/L (ref 135–145)
TCO2: 32 mmol/L (ref 22–32)

## 2021-08-03 SURGERY — INSERTION, GOLD SEEDS
Anesthesia: Monitor Anesthesia Care | Site: Prostate

## 2021-08-03 MED ORDER — CEFAZOLIN SODIUM-DEXTROSE 2-4 GM/100ML-% IV SOLN
INTRAVENOUS | Status: AC
  Filled 2021-08-03: qty 100

## 2021-08-03 MED ORDER — LACTATED RINGERS IV SOLN: INTRAVENOUS | Status: DC

## 2021-08-03 MED ORDER — KETOROLAC TROMETHAMINE 30 MG/ML IJ SOLN: 30.0000 mg | Freq: Once | INTRAMUSCULAR | Status: DC | PRN

## 2021-08-03 MED ORDER — FLEET ENEMA 7-19 GM/118ML RE ENEM: 1.0000 | ENEMA | Freq: Once | RECTAL | Status: DC

## 2021-08-03 MED ORDER — OXYCODONE HCL 5 MG/5ML PO SOLN: 5.0000 mg | Freq: Once | ORAL | Status: DC | PRN

## 2021-08-03 MED ORDER — ONDANSETRON HCL 4 MG/2ML IJ SOLN: 4.0000 mg | Freq: Once | INTRAMUSCULAR | Status: DC | PRN

## 2021-08-03 MED ORDER — PROPOFOL 500 MG/50ML IV EMUL
INTRAVENOUS | Status: DC | PRN
Start: 2021-08-03 — End: 2021-08-03
  Administered 2021-08-03: 125 ug/kg/min via INTRAVENOUS

## 2021-08-03 MED ORDER — LIDOCAINE HCL (PF) 2 % IJ SOLN
INTRAMUSCULAR | Status: AC
  Filled 2021-08-03: qty 5

## 2021-08-03 MED ORDER — PROPOFOL 10 MG/ML IV BOLUS
INTRAVENOUS | Status: DC | PRN
  Administered 2021-08-03 (×2): 20 mg via INTRAVENOUS

## 2021-08-03 MED ORDER — LIDOCAINE HCL 2 % IJ SOLN
INTRAMUSCULAR | Status: DC | PRN
  Administered 2021-08-03: 10 mL

## 2021-08-03 MED ORDER — MIDAZOLAM HCL 5 MG/5ML IJ SOLN
INTRAMUSCULAR | Status: DC | PRN
  Administered 2021-08-03: 2 mg via INTRAVENOUS

## 2021-08-03 MED ORDER — MIDAZOLAM HCL 2 MG/2ML IJ SOLN
INTRAMUSCULAR | Status: AC
  Filled 2021-08-03: qty 2

## 2021-08-03 MED ORDER — OXYCODONE HCL 5 MG PO TABS: 5.0000 mg | ORAL_TABLET | Freq: Once | ORAL | Status: DC | PRN

## 2021-08-03 MED ORDER — LIDOCAINE 2% (20 MG/ML) 5 ML SYRINGE
INTRAMUSCULAR | Status: DC | PRN
  Administered 2021-08-03: 100 mg via INTRAVENOUS

## 2021-08-03 MED ORDER — HYDROMORPHONE HCL 1 MG/ML IJ SOLN: 0.2500 mg | INTRAMUSCULAR | Status: DC | PRN

## 2021-08-03 MED ORDER — CEFAZOLIN SODIUM-DEXTROSE 2-4 GM/100ML-% IV SOLN
2.0000 g | Freq: Once | INTRAVENOUS | Status: AC
  Administered 2021-08-03: 2 g via INTRAVENOUS

## 2021-08-03 MED ORDER — SODIUM CHLORIDE (PF) 0.9 % IJ SOLN
INTRAMUSCULAR | Status: DC | PRN
  Administered 2021-08-03: 10 mL via INTRAVENOUS

## 2021-08-03 SURGICAL SUPPLY — 24 items
BLADE CLIPPER SENSICLIP SURGIC (BLADE) ×2 IMPLANT
CNTNR URN SCR LID CUP LEK RST (MISCELLANEOUS) ×1 IMPLANT
CONT SPEC 4OZ STRL OR WHT (MISCELLANEOUS) ×2
COVER BACK TABLE 60X90IN (DRAPES) ×2 IMPLANT
DRSG IV TEGADERM 3.5X4.5 STRL (GAUZE/BANDAGES/DRESSINGS) ×1 IMPLANT
DRSG TEGADERM 4X4.75 (GAUZE/BANDAGES/DRESSINGS) ×2 IMPLANT
DRSG TEGADERM 8X12 (GAUZE/BANDAGES/DRESSINGS) ×2 IMPLANT
GAUZE SPONGE 4X4 12PLY STRL (GAUZE/BANDAGES/DRESSINGS) ×2 IMPLANT
GLOVE SURG ENC MOIS LTX SZ8 (GLOVE) ×2 IMPLANT
IMPL SPACEOAR VUE SYSTEM (Spacer) ×1 IMPLANT
IMPLANT SPACEOAR VUE SYSTEM (Spacer) ×2 IMPLANT
KIT TURNOVER CYSTO (KITS) ×2 IMPLANT
MARKER GOLD PRELOAD 1.2X3 (Urological Implant) ×3 IMPLANT
MARKER SKIN DUAL TIP RULER LAB (MISCELLANEOUS) ×2 IMPLANT
NDL SPNL 22GX7 QUINCKE BK (NEEDLE) ×1 IMPLANT
NEEDLE SPNL 22GX7 QUINCKE BK (NEEDLE) ×2 IMPLANT
SEED GOLD PRELOAD 1.2X3 (Urological Implant) ×2 IMPLANT
SHEATH ULTRASOUND LF (SHEATH) IMPLANT
SHEATH ULTRASOUND LTX NONSTRL (SHEATH) IMPLANT
SURGILUBE 2OZ TUBE FLIPTOP (MISCELLANEOUS) ×2 IMPLANT
SYR 10ML LL (SYRINGE) IMPLANT
SYR CONTROL 10ML LL (SYRINGE) ×2 IMPLANT
TOWEL OR 17X26 10 PK STRL BLUE (TOWEL DISPOSABLE) ×2 IMPLANT
UNDERPAD 30X36 HEAVY ABSORB (UNDERPADS AND DIAPERS) ×2 IMPLANT

## 2021-08-03 NOTE — Anesthesia Postprocedure Evaluation (Signed)
Anesthesia Post Note ? ?Patient: Christopher Watson ? ?Procedure(s) Performed: GOLD SEED IMPLANT (Prostate) ?SPACE OAR INSTILLATION (Prostate) ? ?  ? ?Patient location during evaluation: PACU ?Anesthesia Type: MAC ?Level of consciousness: awake and alert ?Pain management: pain level controlled ?Vital Signs Assessment: post-procedure vital signs reviewed and stable ?Respiratory status: spontaneous breathing, nonlabored ventilation, respiratory function stable and patient connected to nasal cannula oxygen ?Cardiovascular status: stable and blood pressure returned to baseline ?Postop Assessment: no apparent nausea or vomiting ?Anesthetic complications: no ? ? ?No notable events documented. ? ?Last Vitals:  ?Vitals:  ? 08/03/21 0954 08/03/21 1015  ?BP: 131/90 (!) 140/103  ?Pulse: 73 72  ?Resp: 14 16  ?Temp: 36.5 ?C 36.5 ?C  ?SpO2: 100% 100%  ?  ?Last Pain:  ?Vitals:  ? 08/03/21 1015  ?TempSrc:   ?PainSc: 0-No pain  ? ? ?  ?  ?  ?  ?  ?  ? ?Barnet Glasgow ? ? ? ? ?

## 2021-08-03 NOTE — Transfer of Care (Signed)
Immediate Anesthesia Transfer of Care Note ? ?Patient: TIANT PEIXOTO ? ?Procedure(s) Performed: GOLD SEED IMPLANT (Prostate) ?SPACE OAR INSTILLATION (Prostate) ? ?Patient Location: PACU ? ?Anesthesia Type:MAC ? ?Level of Consciousness: drowsy ? ?Airway & Oxygen Therapy: Patient Spontanous Breathing and Patient connected to nasal cannula oxygen ? ?Post-op Assessment: Report given to RN ? ?Post vital signs: Reviewed and stable ? ?Last Vitals:  ?Vitals Value Taken Time  ?BP 121/86 08/03/21 0922  ?Temp    ?Pulse 79 08/03/21 0923  ?Resp 14 08/03/21 0923  ?SpO2 100 % 08/03/21 0923  ?Vitals shown include unvalidated device data. ? ?Last Pain:  ?Vitals:  ? 08/03/21 0703  ?TempSrc: Oral  ?PainSc: 0-No pain  ?   ? ?Patients Stated Pain Goal: 5 (08/03/21 0703) ? ?Complications: No notable events documented. ?

## 2021-08-03 NOTE — Op Note (Signed)
Preoperative diagnosis: Prostate cancer ? ?Postoperative diagnosis: Same ? ?Principal procedure: Placement of fiducial markers, placement of SpaceOAR ? ?Surgeon: Diona Fanti ? ?Anesthesia: MAC ? ?Complications: None ? ?Estimated blood loss: None ? ?Indications: 56 year old male, scheduled for EBRT, presents at this time for placement of fiducial markers and SpaceOAR prior to initiation of his EBRT. ? ?Description of procedure: The patient was properly identified in the holding area, received preoperative IV antibiotics was taken to the operating room.  MAC was administered.  He was then placed in the dorsolithotomy position.  Perineum was prepped and draped.  Proper timeout performed. ? ?Transrectal ultrasound probe was placed.  Prostate was scanned and easily visualized.  3 fiducial markers were then placed following infiltration of lidocaine in the perineum.  2 fiducial markers were placed in the right prostate-1 at the base, 1 at the apex.  The third marker was placed in the left mid prostate.  Following this, using an 18-gauge spinal needle which was passed into the fat plane between the prostate and the rectum (in the midline).  Once adequate positioning was obtained, a puff of saline (approximately 5 mL) was utilized to open the space up.  Once adequate space was formed and confirmed using ultrasound, SpaceOAR gel was carefully injected over approximately 12 seconds.  Excellent placement was seen using ultrasound.  At this point, the needle was removed.  The procedure was terminated, the patient awakened and taken the PACU in stable condition. ?

## 2021-08-03 NOTE — Interval H&P Note (Signed)
History and Physical Interval Note: ? ?08/03/2021 ?7:40 AM ? ?Christopher Watson  has presented today for surgery, with the diagnosis of PROSTATE CANCER.  The various methods of treatment have been discussed with the patient and family. After consideration of risks, benefits and other options for treatment, the patient has consented to  Procedure(s): ?GOLD SEED IMPLANT (N/A) ?SPACE OAR INSTILLATION (N/A) as a surgical intervention.  The patient's history has been reviewed, patient examined, no change in status, stable for surgery.  I have reviewed the patient's chart and labs.  Questions were answered to the patient's satisfaction.   ? ? ?Christopher Watson ? ? ?

## 2021-08-03 NOTE — Anesthesia Procedure Notes (Signed)
Procedure Name: [place] ?Date/Time: 08/03/2021 8:56 AM ?Performed by: Bonney Aid, CRNA ?Pre-anesthesia Checklist: Patient identified, Emergency Drugs available, Suction available, Patient being monitored and Timeout performed ?Patient Re-evaluated:Patient Re-evaluated prior to induction ?Placement Confirmation: positive ETCO2 ? ? ? ? ?

## 2021-08-03 NOTE — Discharge Instructions (Addendum)
Follow-up as scheduled with Dr. Tammi Klippel and Dr. Junious Silk ? ?Take it easy today, starting tomorrow you may resume normal activities ? ?Report any difficulties with urination or fever to Dr. Lyndal Rainbow office ? ? ? ?Post Anesthesia Home Care Instructions ? ?Activity: ?Get plenty of rest for the remainder of the day. A responsible individual must stay with you for 24 hours following the procedure.  ?For the next 24 hours, DO NOT: ?-Drive a car ?-Paediatric nurse ?-Drink alcoholic beverages ?-Take any medication unless instructed by your physician ?-Make any legal decisions or sign important papers. ? ?Meals: ?Start with liquid foods such as gelatin or soup. Progress to regular foods as tolerated. Avoid greasy, spicy, heavy foods. If nausea and/or vomiting occur, drink only clear liquids until the nausea and/or vomiting subsides. Call your physician if vomiting continues. ? ?Special Instructions/Symptoms: ?Your throat may feel dry or sore from the anesthesia or the breathing tube placed in your throat during surgery. If this causes discomfort, gargle with warm salt water. The discomfort should disappear within 24 hours. ? ?    ?

## 2021-08-04 ENCOUNTER — Telehealth: Payer: Self-pay | Admitting: *Deleted

## 2021-08-04 NOTE — Telephone Encounter (Signed)
CALLED PATIENT'S BROTHER - Christopher Watson TO REMIND OF SIM APPT. FOR 08-07-21- ARRIVAL TIME- 9:45 AM @ [facility], PATIENT TO ARRIVE WITH A FULL BLADDER AND AN EMPTY BOWEL, SPOKE WITH PATIENT'S BROTHER Christopher AND HE IS AWARE OF THIS APPT. AND THE INSTRUCTIONS ?

## 2021-08-06 DIAGNOSIS — Z191 Hormone sensitive malignancy status: Secondary | ICD-10-CM | POA: Diagnosis not present

## 2021-08-06 DIAGNOSIS — C61 Malignant neoplasm of prostate: Secondary | ICD-10-CM | POA: Diagnosis not present

## 2021-08-06 NOTE — Progress Notes (Signed)
?  Radiation Oncology         [phone] ?________________________________ ? ?Name: Christopher Watson MRN: [id number]  ?Date: 08/07/2021  DOB: February 26, 1966 ? ?SIMULATION AND TREATMENT PLANNING NOTE ? ?  ICD-10-CM   ?1. Malignant neoplasm of prostate ([place])  C61   ?  ? ? ?DIAGNOSIS:  56 y.o. gentleman with Stage T2c adenocarcinoma of the prostate with Gleason score of 4+4, and PSA of 5.83. ? ?NARRATIVE:  The patient was brought to the [facility].  Identity was confirmed.  All relevant records and images related to the planned course of therapy were reviewed.  The patient freely provided informed written consent to proceed with treatment after reviewing the details related to the planned course of therapy. The consent form was witnessed and verified by the simulation staff.  Then, the patient was set-up in a stable reproducible supine position for radiation therapy.  A vacuum lock pillow device was custom fabricated to position his legs in a reproducible immobilized position.  Then, I performed a urethrogram under sterile conditions to identify the prostatic bed.  CT images were obtained.  Surface markings were placed.  The CT images were loaded into the planning software.  Then the prostate bed target, pelvic lymph node target and avoidance structures including the rectum, bladder, bowel and hips were contoured.  Treatment planning then occurred.  The radiation prescription was entered and confirmed.  A total of one complex treatment devices were fabricated. I have requested : Intensity Modulated Radiotherapy (IMRT) is medically necessary for this case for the following reason:  Rectal sparing.. ? ?PLAN:  The patient will receive 45 Gy in 25 fractions of 1.8 Gy, followed by a boost to the prostate to a total dose of 75 Gy with 15 additional fractions of 2 Gy. ? ? ?________________________________ ? ?Sheral Apley Tammi Klippel, M.D. ? ?

## 2021-08-07 ENCOUNTER — Ambulatory Visit
Admission: RE | Admit: 2021-08-07 | Discharge: 2021-08-07 | Disposition: A | Discharge status: Home / Self Care | Payer: Medicare HMO | Source: Ambulatory Visit | Attending: Radiation Oncology | Admitting: Radiation Oncology

## 2021-08-07 ENCOUNTER — Encounter (HOSPITAL_BASED_OUTPATIENT_CLINIC_OR_DEPARTMENT_OTHER): Payer: Self-pay | Admitting: Urology

## 2021-08-07 ENCOUNTER — Other Ambulatory Visit: Payer: Self-pay

## 2021-08-07 DIAGNOSIS — Z191 Hormone sensitive malignancy status: Secondary | ICD-10-CM | POA: Diagnosis not present

## 2021-08-07 DIAGNOSIS — C61 Malignant neoplasm of prostate: Secondary | ICD-10-CM | POA: Insufficient documentation

## 2021-08-10 DIAGNOSIS — C61 Malignant neoplasm of prostate: Secondary | ICD-10-CM | POA: Diagnosis not present

## 2021-08-10 DIAGNOSIS — Z191 Hormone sensitive malignancy status: Secondary | ICD-10-CM | POA: Diagnosis not present

## 2021-08-14 ENCOUNTER — Other Ambulatory Visit: Payer: Self-pay | Admitting: Internal Medicine

## 2021-08-14 DIAGNOSIS — I10 Essential (primary) hypertension: Secondary | ICD-10-CM

## 2021-08-17 ENCOUNTER — Other Ambulatory Visit: Payer: Self-pay

## 2021-08-17 ENCOUNTER — Ambulatory Visit
Admission: RE | Admit: 2021-08-17 | Discharge: 2021-08-17 | Disposition: A | Discharge status: Home / Self Care | Payer: Medicare HMO | Source: Ambulatory Visit | Attending: Radiation Oncology | Admitting: Radiation Oncology

## 2021-08-17 DIAGNOSIS — C61 Malignant neoplasm of prostate: Secondary | ICD-10-CM | POA: Diagnosis not present

## 2021-08-17 DIAGNOSIS — Z510 Encounter for antineoplastic radiation therapy: Secondary | ICD-10-CM | POA: Diagnosis not present

## 2021-08-17 DIAGNOSIS — Z191 Hormone sensitive malignancy status: Secondary | ICD-10-CM | POA: Diagnosis not present

## 2021-08-18 ENCOUNTER — Ambulatory Visit
Admission: RE | Admit: 2021-08-18 | Discharge: 2021-08-18 | Disposition: A | Discharge status: Home / Self Care | Payer: Medicare HMO | Source: Ambulatory Visit | Attending: Radiation Oncology | Admitting: Radiation Oncology

## 2021-08-18 DIAGNOSIS — Z510 Encounter for antineoplastic radiation therapy: Secondary | ICD-10-CM | POA: Diagnosis not present

## 2021-08-18 DIAGNOSIS — Z191 Hormone sensitive malignancy status: Secondary | ICD-10-CM | POA: Diagnosis not present

## 2021-08-18 DIAGNOSIS — C61 Malignant neoplasm of prostate: Secondary | ICD-10-CM | POA: Diagnosis not present

## 2021-08-21 ENCOUNTER — Other Ambulatory Visit: Payer: Self-pay

## 2021-08-21 ENCOUNTER — Ambulatory Visit
Admission: RE | Admit: 2021-08-21 | Discharge: 2021-08-21 | Disposition: A | Discharge status: Home / Self Care | Payer: Medicare HMO | Source: Ambulatory Visit | Attending: Radiation Oncology | Admitting: Radiation Oncology

## 2021-08-21 DIAGNOSIS — C61 Malignant neoplasm of prostate: Secondary | ICD-10-CM | POA: Diagnosis not present

## 2021-08-21 DIAGNOSIS — Z510 Encounter for antineoplastic radiation therapy: Secondary | ICD-10-CM | POA: Diagnosis not present

## 2021-08-21 DIAGNOSIS — Z191 Hormone sensitive malignancy status: Secondary | ICD-10-CM | POA: Diagnosis not present

## 2021-08-22 ENCOUNTER — Ambulatory Visit
Admission: RE | Admit: 2021-08-22 | Discharge: 2021-08-22 | Disposition: A | Discharge status: Home / Self Care | Payer: Medicare HMO | Source: Ambulatory Visit | Attending: Radiation Oncology | Admitting: Radiation Oncology

## 2021-08-22 DIAGNOSIS — C61 Malignant neoplasm of prostate: Secondary | ICD-10-CM | POA: Diagnosis not present

## 2021-08-22 DIAGNOSIS — Z510 Encounter for antineoplastic radiation therapy: Secondary | ICD-10-CM | POA: Diagnosis not present

## 2021-08-22 DIAGNOSIS — Z191 Hormone sensitive malignancy status: Secondary | ICD-10-CM | POA: Diagnosis not present

## 2021-08-23 ENCOUNTER — Ambulatory Visit
Admission: RE | Admit: 2021-08-23 | Discharge: 2021-08-23 | Disposition: A | Discharge status: Home / Self Care | Payer: Medicare HMO | Source: Ambulatory Visit | Attending: Radiation Oncology | Admitting: Radiation Oncology

## 2021-08-23 ENCOUNTER — Other Ambulatory Visit: Payer: Self-pay

## 2021-08-23 DIAGNOSIS — C61 Malignant neoplasm of prostate: Secondary | ICD-10-CM | POA: Diagnosis not present

## 2021-08-23 DIAGNOSIS — Z191 Hormone sensitive malignancy status: Secondary | ICD-10-CM | POA: Diagnosis not present

## 2021-08-23 DIAGNOSIS — Z510 Encounter for antineoplastic radiation therapy: Secondary | ICD-10-CM | POA: Diagnosis not present

## 2021-08-24 ENCOUNTER — Ambulatory Visit
Admission: RE | Admit: 2021-08-24 | Discharge: 2021-08-24 | Disposition: A | Discharge status: Home / Self Care | Payer: Medicare HMO | Source: Ambulatory Visit | Attending: Radiation Oncology | Admitting: Radiation Oncology

## 2021-08-24 DIAGNOSIS — Z191 Hormone sensitive malignancy status: Secondary | ICD-10-CM | POA: Diagnosis not present

## 2021-08-24 DIAGNOSIS — C61 Malignant neoplasm of prostate: Secondary | ICD-10-CM | POA: Diagnosis not present

## 2021-08-24 DIAGNOSIS — Z510 Encounter for antineoplastic radiation therapy: Secondary | ICD-10-CM | POA: Diagnosis not present

## 2021-08-24 NOTE — Progress Notes (Signed)
Pt here for patient teaching.   ? ?Pt given Radiation and You booklet.   ? ?Reviewed areas of pertinence such as diarrhea, fatigue, hair loss in treatment field, nausea and vomiting, sexual and fertility changes, skin changes, and urinary and bladder changes .  ? ?Pt able to give teach back of to pat skin, use unscented/gentle soap, use baby wipes, have Imodium on hand, drink plenty of water, and sitz bath,avoid applying anything to skin within 4 hours of treatment.  ? ?Pt verbalizes understanding of information given and will contact nursing with any questions or concerns.   ? ? ? ? ? ? ?  ?

## 2021-08-25 ENCOUNTER — Ambulatory Visit
Admission: RE | Admit: 2021-08-25 | Discharge: 2021-08-25 | Disposition: A | Discharge status: Home / Self Care | Payer: Medicare HMO | Source: Ambulatory Visit | Attending: Radiation Oncology | Admitting: Radiation Oncology

## 2021-08-25 ENCOUNTER — Other Ambulatory Visit: Payer: Self-pay

## 2021-08-25 DIAGNOSIS — Z191 Hormone sensitive malignancy status: Secondary | ICD-10-CM | POA: Diagnosis not present

## 2021-08-25 DIAGNOSIS — C61 Malignant neoplasm of prostate: Secondary | ICD-10-CM | POA: Diagnosis not present

## 2021-08-25 DIAGNOSIS — Z510 Encounter for antineoplastic radiation therapy: Secondary | ICD-10-CM | POA: Diagnosis not present

## 2021-08-28 ENCOUNTER — Ambulatory Visit
Admission: RE | Admit: 2021-08-28 | Discharge: 2021-08-28 | Disposition: A | Discharge status: Home / Self Care | Payer: Medicare HMO | Source: Ambulatory Visit | Attending: Radiation Oncology | Admitting: Radiation Oncology

## 2021-08-28 ENCOUNTER — Other Ambulatory Visit: Payer: Self-pay

## 2021-08-28 DIAGNOSIS — Z191 Hormone sensitive malignancy status: Secondary | ICD-10-CM | POA: Diagnosis not present

## 2021-08-28 DIAGNOSIS — C61 Malignant neoplasm of prostate: Secondary | ICD-10-CM | POA: Diagnosis not present

## 2021-08-28 DIAGNOSIS — Z510 Encounter for antineoplastic radiation therapy: Secondary | ICD-10-CM | POA: Diagnosis not present

## 2021-08-29 ENCOUNTER — Ambulatory Visit
Admission: RE | Admit: 2021-08-29 | Discharge: 2021-08-29 | Disposition: A | Discharge status: Home / Self Care | Payer: Medicare HMO | Source: Ambulatory Visit | Attending: Radiation Oncology | Admitting: Radiation Oncology

## 2021-08-29 ENCOUNTER — Other Ambulatory Visit: Payer: Self-pay

## 2021-08-29 DIAGNOSIS — Z191 Hormone sensitive malignancy status: Secondary | ICD-10-CM | POA: Diagnosis not present

## 2021-08-29 DIAGNOSIS — Z510 Encounter for antineoplastic radiation therapy: Secondary | ICD-10-CM | POA: Diagnosis not present

## 2021-08-29 DIAGNOSIS — C61 Malignant neoplasm of prostate: Secondary | ICD-10-CM | POA: Diagnosis not present

## 2021-08-29 LAB — RAD ONC ARIA SESSION SUMMARY
Course Elapsed Days: 12
Plan Fractions Treated to Date: 9
Plan Prescribed Dose Per Fraction: 1.8 Gy
Plan Total Fractions Prescribed: 25
Plan Total Prescribed Dose: 45 Gy
Reference Point Dosage Given to Date: 16.2 Gy
Reference Point Session Dosage Given: 1.8 Gy
Session Number: 9

## 2021-08-30 ENCOUNTER — Ambulatory Visit
Admission: RE | Admit: 2021-08-30 | Discharge: 2021-08-30 | Disposition: A | Discharge status: Home / Self Care | Payer: Medicare HMO | Source: Ambulatory Visit | Attending: Radiation Oncology | Admitting: Radiation Oncology

## 2021-08-30 ENCOUNTER — Other Ambulatory Visit: Payer: Self-pay

## 2021-08-30 DIAGNOSIS — Z510 Encounter for antineoplastic radiation therapy: Secondary | ICD-10-CM | POA: Diagnosis not present

## 2021-08-30 DIAGNOSIS — C61 Malignant neoplasm of prostate: Secondary | ICD-10-CM | POA: Diagnosis not present

## 2021-08-30 DIAGNOSIS — Z191 Hormone sensitive malignancy status: Secondary | ICD-10-CM | POA: Diagnosis not present

## 2021-08-30 LAB — RAD ONC ARIA SESSION SUMMARY
Course Elapsed Days: 13
Plan Fractions Treated to Date: 10
Plan Prescribed Dose Per Fraction: 1.8 Gy
Plan Total Fractions Prescribed: 25
Plan Total Prescribed Dose: 45 Gy
Reference Point Dosage Given to Date: 18 Gy
Reference Point Session Dosage Given: 1.8 Gy
Session Number: 10

## 2021-08-31 ENCOUNTER — Ambulatory Visit
Admission: RE | Admit: 2021-08-31 | Discharge: 2021-08-31 | Disposition: A | Discharge status: Home / Self Care | Payer: Medicare HMO | Source: Ambulatory Visit | Attending: Radiation Oncology | Admitting: Radiation Oncology

## 2021-08-31 ENCOUNTER — Other Ambulatory Visit: Payer: Self-pay

## 2021-08-31 DIAGNOSIS — Z510 Encounter for antineoplastic radiation therapy: Secondary | ICD-10-CM | POA: Diagnosis not present

## 2021-08-31 DIAGNOSIS — Z191 Hormone sensitive malignancy status: Secondary | ICD-10-CM | POA: Diagnosis not present

## 2021-08-31 DIAGNOSIS — C61 Malignant neoplasm of prostate: Secondary | ICD-10-CM | POA: Diagnosis not present

## 2021-08-31 LAB — RAD ONC ARIA SESSION SUMMARY
Course Elapsed Days: 14
Plan Fractions Treated to Date: 11
Plan Prescribed Dose Per Fraction: 1.8 Gy
Plan Total Fractions Prescribed: 25
Plan Total Prescribed Dose: 45 Gy
Reference Point Dosage Given to Date: 19.8 Gy
Reference Point Session Dosage Given: 1.8 Gy
Session Number: 11

## 2021-09-01 ENCOUNTER — Other Ambulatory Visit: Payer: Self-pay

## 2021-09-01 ENCOUNTER — Ambulatory Visit
Admission: RE | Admit: 2021-09-01 | Discharge: 2021-09-01 | Disposition: A | Discharge status: Home / Self Care | Payer: Medicare HMO | Source: Ambulatory Visit | Attending: Radiation Oncology | Admitting: Radiation Oncology

## 2021-09-01 DIAGNOSIS — Z191 Hormone sensitive malignancy status: Secondary | ICD-10-CM | POA: Diagnosis not present

## 2021-09-01 DIAGNOSIS — C61 Malignant neoplasm of prostate: Secondary | ICD-10-CM | POA: Diagnosis not present

## 2021-09-01 DIAGNOSIS — Z510 Encounter for antineoplastic radiation therapy: Secondary | ICD-10-CM | POA: Diagnosis not present

## 2021-09-01 LAB — RAD ONC ARIA SESSION SUMMARY
Course Elapsed Days: 15
Plan Fractions Treated to Date: 12
Plan Prescribed Dose Per Fraction: 1.8 Gy
Plan Total Fractions Prescribed: 25
Plan Total Prescribed Dose: 45 Gy
Reference Point Dosage Given to Date: 21.6 Gy
Reference Point Session Dosage Given: 1.8 Gy
Session Number: 12

## 2021-09-04 ENCOUNTER — Other Ambulatory Visit: Payer: Self-pay

## 2021-09-04 ENCOUNTER — Ambulatory Visit
Admission: RE | Admit: 2021-09-04 | Discharge: 2021-09-04 | Disposition: A | Discharge status: Home / Self Care | Payer: Medicare HMO | Source: Ambulatory Visit | Attending: Radiation Oncology | Admitting: Radiation Oncology

## 2021-09-04 DIAGNOSIS — Z510 Encounter for antineoplastic radiation therapy: Secondary | ICD-10-CM | POA: Diagnosis not present

## 2021-09-04 DIAGNOSIS — C61 Malignant neoplasm of prostate: Secondary | ICD-10-CM | POA: Diagnosis not present

## 2021-09-04 DIAGNOSIS — Z191 Hormone sensitive malignancy status: Secondary | ICD-10-CM | POA: Diagnosis not present

## 2021-09-04 LAB — RAD ONC ARIA SESSION SUMMARY
Course Elapsed Days: 18
Plan Fractions Treated to Date: 13
Plan Prescribed Dose Per Fraction: 1.8 Gy
Plan Total Fractions Prescribed: 25
Plan Total Prescribed Dose: 45 Gy
Reference Point Dosage Given to Date: 23.4 Gy
Reference Point Session Dosage Given: 1.8 Gy
Session Number: 13

## 2021-09-05 ENCOUNTER — Ambulatory Visit
Admission: RE | Admit: 2021-09-05 | Discharge: 2021-09-05 | Disposition: A | Discharge status: Home / Self Care | Payer: Medicare HMO | Source: Ambulatory Visit | Attending: Radiation Oncology | Admitting: Radiation Oncology

## 2021-09-05 ENCOUNTER — Other Ambulatory Visit: Payer: Self-pay

## 2021-09-05 DIAGNOSIS — Z510 Encounter for antineoplastic radiation therapy: Secondary | ICD-10-CM | POA: Diagnosis not present

## 2021-09-05 DIAGNOSIS — Z191 Hormone sensitive malignancy status: Secondary | ICD-10-CM | POA: Diagnosis not present

## 2021-09-05 DIAGNOSIS — C61 Malignant neoplasm of prostate: Secondary | ICD-10-CM | POA: Diagnosis not present

## 2021-09-05 LAB — RAD ONC ARIA SESSION SUMMARY
Course Elapsed Days: 19
Plan Fractions Treated to Date: 14
Plan Prescribed Dose Per Fraction: 1.8 Gy
Plan Total Fractions Prescribed: 25
Plan Total Prescribed Dose: 45 Gy
Reference Point Dosage Given to Date: 25.2 Gy
Reference Point Session Dosage Given: 1.8 Gy
Session Number: 14

## 2021-09-06 ENCOUNTER — Other Ambulatory Visit: Payer: Self-pay

## 2021-09-06 ENCOUNTER — Ambulatory Visit
Admission: RE | Admit: 2021-09-06 | Discharge: 2021-09-06 | Disposition: A | Discharge status: Home / Self Care | Payer: Medicare HMO | Source: Ambulatory Visit | Attending: Radiation Oncology | Admitting: Radiation Oncology

## 2021-09-06 DIAGNOSIS — C61 Malignant neoplasm of prostate: Secondary | ICD-10-CM | POA: Diagnosis not present

## 2021-09-06 DIAGNOSIS — Z191 Hormone sensitive malignancy status: Secondary | ICD-10-CM | POA: Diagnosis not present

## 2021-09-06 DIAGNOSIS — Z510 Encounter for antineoplastic radiation therapy: Secondary | ICD-10-CM | POA: Diagnosis not present

## 2021-09-06 LAB — RAD ONC ARIA SESSION SUMMARY
Course Elapsed Days: 20
Plan Fractions Treated to Date: 15
Plan Prescribed Dose Per Fraction: 1.8 Gy
Plan Total Fractions Prescribed: 25
Plan Total Prescribed Dose: 45 Gy
Reference Point Dosage Given to Date: 27 Gy
Reference Point Session Dosage Given: 1.8 Gy
Session Number: 15

## 2021-09-07 ENCOUNTER — Ambulatory Visit
Admission: RE | Admit: 2021-09-07 | Discharge: 2021-09-07 | Disposition: A | Discharge status: Home / Self Care | Payer: Medicare HMO | Source: Ambulatory Visit | Attending: Radiation Oncology | Admitting: Radiation Oncology

## 2021-09-07 ENCOUNTER — Other Ambulatory Visit: Payer: Self-pay

## 2021-09-07 DIAGNOSIS — C61 Malignant neoplasm of prostate: Secondary | ICD-10-CM | POA: Diagnosis not present

## 2021-09-07 DIAGNOSIS — Z510 Encounter for antineoplastic radiation therapy: Secondary | ICD-10-CM | POA: Diagnosis not present

## 2021-09-07 DIAGNOSIS — Z191 Hormone sensitive malignancy status: Secondary | ICD-10-CM | POA: Diagnosis not present

## 2021-09-07 LAB — RAD ONC ARIA SESSION SUMMARY
Course Elapsed Days: 21
Plan Fractions Treated to Date: 16
Plan Prescribed Dose Per Fraction: 1.8 Gy
Plan Total Fractions Prescribed: 25
Plan Total Prescribed Dose: 45 Gy
Reference Point Dosage Given to Date: 28.8 Gy
Reference Point Session Dosage Given: 1.8 Gy
Session Number: 16

## 2021-09-08 ENCOUNTER — Other Ambulatory Visit: Payer: Self-pay

## 2021-09-08 ENCOUNTER — Ambulatory Visit
Admission: RE | Admit: 2021-09-08 | Discharge: 2021-09-08 | Disposition: A | Discharge status: Home / Self Care | Payer: Medicare HMO | Source: Ambulatory Visit | Attending: Radiation Oncology | Admitting: Radiation Oncology

## 2021-09-08 DIAGNOSIS — Z510 Encounter for antineoplastic radiation therapy: Secondary | ICD-10-CM | POA: Diagnosis not present

## 2021-09-08 DIAGNOSIS — Z191 Hormone sensitive malignancy status: Secondary | ICD-10-CM | POA: Diagnosis not present

## 2021-09-08 DIAGNOSIS — C61 Malignant neoplasm of prostate: Secondary | ICD-10-CM | POA: Diagnosis not present

## 2021-09-08 LAB — RAD ONC ARIA SESSION SUMMARY
Course Elapsed Days: 22
Plan Fractions Treated to Date: 17
Plan Prescribed Dose Per Fraction: 1.8 Gy
Plan Total Fractions Prescribed: 25
Plan Total Prescribed Dose: 45 Gy
Reference Point Dosage Given to Date: 30.6 Gy
Reference Point Session Dosage Given: 1.8 Gy
Session Number: 17

## 2021-09-11 ENCOUNTER — Ambulatory Visit
Admission: RE | Admit: 2021-09-11 | Discharge: 2021-09-11 | Disposition: A | Discharge status: Home / Self Care | Payer: Medicare HMO | Source: Ambulatory Visit | Attending: Radiation Oncology | Admitting: Radiation Oncology

## 2021-09-11 ENCOUNTER — Other Ambulatory Visit: Payer: Self-pay

## 2021-09-11 DIAGNOSIS — C61 Malignant neoplasm of prostate: Secondary | ICD-10-CM | POA: Insufficient documentation

## 2021-09-11 DIAGNOSIS — Z191 Hormone sensitive malignancy status: Secondary | ICD-10-CM | POA: Diagnosis not present

## 2021-09-11 DIAGNOSIS — Z510 Encounter for antineoplastic radiation therapy: Secondary | ICD-10-CM | POA: Diagnosis not present

## 2021-09-11 LAB — RAD ONC ARIA SESSION SUMMARY
Course Elapsed Days: 25
Plan Fractions Treated to Date: 18
Plan Prescribed Dose Per Fraction: 1.8 Gy
Plan Total Fractions Prescribed: 25
Plan Total Prescribed Dose: 45 Gy
Reference Point Dosage Given to Date: 32.4 Gy
Reference Point Session Dosage Given: 1.8 Gy
Session Number: 18

## 2021-09-12 ENCOUNTER — Other Ambulatory Visit: Payer: Self-pay

## 2021-09-12 ENCOUNTER — Ambulatory Visit
Admission: RE | Admit: 2021-09-12 | Discharge: 2021-09-12 | Disposition: A | Discharge status: Home / Self Care | Payer: Medicare HMO | Source: Ambulatory Visit | Attending: Radiation Oncology | Admitting: Radiation Oncology

## 2021-09-12 DIAGNOSIS — Z510 Encounter for antineoplastic radiation therapy: Secondary | ICD-10-CM | POA: Diagnosis not present

## 2021-09-12 DIAGNOSIS — C61 Malignant neoplasm of prostate: Secondary | ICD-10-CM | POA: Diagnosis not present

## 2021-09-12 DIAGNOSIS — Z191 Hormone sensitive malignancy status: Secondary | ICD-10-CM | POA: Diagnosis not present

## 2021-09-12 LAB — RAD ONC ARIA SESSION SUMMARY
Course Elapsed Days: 26
Plan Fractions Treated to Date: 19
Plan Prescribed Dose Per Fraction: 1.8 Gy
Plan Total Fractions Prescribed: 25
Plan Total Prescribed Dose: 45 Gy
Reference Point Dosage Given to Date: 34.2 Gy
Reference Point Session Dosage Given: 1.8 Gy
Session Number: 19

## 2021-09-13 ENCOUNTER — Other Ambulatory Visit: Payer: Self-pay

## 2021-09-13 ENCOUNTER — Ambulatory Visit
Admission: RE | Admit: 2021-09-13 | Discharge: 2021-09-13 | Disposition: A | Discharge status: Home / Self Care | Payer: Medicare HMO | Source: Ambulatory Visit | Attending: Radiation Oncology | Admitting: Radiation Oncology

## 2021-09-13 DIAGNOSIS — Z191 Hormone sensitive malignancy status: Secondary | ICD-10-CM | POA: Diagnosis not present

## 2021-09-13 DIAGNOSIS — Z510 Encounter for antineoplastic radiation therapy: Secondary | ICD-10-CM | POA: Diagnosis not present

## 2021-09-13 DIAGNOSIS — C61 Malignant neoplasm of prostate: Secondary | ICD-10-CM | POA: Diagnosis not present

## 2021-09-13 LAB — RAD ONC ARIA SESSION SUMMARY
Course Elapsed Days: 27
Plan Fractions Treated to Date: 20
Plan Prescribed Dose Per Fraction: 1.8 Gy
Plan Total Fractions Prescribed: 25
Plan Total Prescribed Dose: 45 Gy
Reference Point Dosage Given to Date: 36 Gy
Reference Point Session Dosage Given: 1.8 Gy
Session Number: 20

## 2021-09-14 ENCOUNTER — Other Ambulatory Visit: Payer: Self-pay

## 2021-09-14 ENCOUNTER — Ambulatory Visit
Admission: RE | Admit: 2021-09-14 | Discharge: 2021-09-14 | Disposition: A | Discharge status: Home / Self Care | Payer: Medicare HMO | Source: Ambulatory Visit | Attending: Radiation Oncology | Admitting: Radiation Oncology

## 2021-09-14 DIAGNOSIS — C61 Malignant neoplasm of prostate: Secondary | ICD-10-CM | POA: Diagnosis not present

## 2021-09-14 DIAGNOSIS — Z510 Encounter for antineoplastic radiation therapy: Secondary | ICD-10-CM | POA: Diagnosis not present

## 2021-09-14 DIAGNOSIS — Z191 Hormone sensitive malignancy status: Secondary | ICD-10-CM | POA: Diagnosis not present

## 2021-09-14 LAB — RAD ONC ARIA SESSION SUMMARY
Course Elapsed Days: 28
Plan Fractions Treated to Date: 21
Plan Prescribed Dose Per Fraction: 1.8 Gy
Plan Total Fractions Prescribed: 25
Plan Total Prescribed Dose: 45 Gy
Reference Point Dosage Given to Date: 37.8 Gy
Reference Point Session Dosage Given: 1.8 Gy
Session Number: 21

## 2021-09-15 ENCOUNTER — Other Ambulatory Visit: Payer: Self-pay

## 2021-09-15 ENCOUNTER — Ambulatory Visit
Admission: RE | Admit: 2021-09-15 | Discharge: 2021-09-15 | Disposition: A | Discharge status: Home / Self Care | Payer: Medicare HMO | Source: Ambulatory Visit | Attending: Radiation Oncology | Admitting: Radiation Oncology

## 2021-09-15 DIAGNOSIS — Z191 Hormone sensitive malignancy status: Secondary | ICD-10-CM | POA: Diagnosis not present

## 2021-09-15 DIAGNOSIS — C61 Malignant neoplasm of prostate: Secondary | ICD-10-CM | POA: Diagnosis not present

## 2021-09-15 DIAGNOSIS — Z510 Encounter for antineoplastic radiation therapy: Secondary | ICD-10-CM | POA: Diagnosis not present

## 2021-09-15 LAB — RAD ONC ARIA SESSION SUMMARY
Course Elapsed Days: 29
Plan Fractions Treated to Date: 22
Plan Prescribed Dose Per Fraction: 1.8 Gy
Plan Total Fractions Prescribed: 25
Plan Total Prescribed Dose: 45 Gy
Reference Point Dosage Given to Date: 39.6 Gy
Reference Point Session Dosage Given: 1.8 Gy
Session Number: 22

## 2021-09-18 ENCOUNTER — Other Ambulatory Visit: Payer: Self-pay

## 2021-09-18 ENCOUNTER — Ambulatory Visit
Admission: RE | Admit: 2021-09-18 | Discharge: 2021-09-18 | Disposition: A | Discharge status: Home / Self Care | Payer: Medicare HMO | Source: Ambulatory Visit | Attending: Radiation Oncology | Admitting: Radiation Oncology

## 2021-09-18 DIAGNOSIS — Z191 Hormone sensitive malignancy status: Secondary | ICD-10-CM | POA: Diagnosis not present

## 2021-09-18 DIAGNOSIS — Z510 Encounter for antineoplastic radiation therapy: Secondary | ICD-10-CM | POA: Diagnosis not present

## 2021-09-18 DIAGNOSIS — C61 Malignant neoplasm of prostate: Secondary | ICD-10-CM | POA: Diagnosis not present

## 2021-09-18 LAB — RAD ONC ARIA SESSION SUMMARY
Course Elapsed Days: 32
Plan Fractions Treated to Date: 23
Plan Prescribed Dose Per Fraction: 1.8 Gy
Plan Total Fractions Prescribed: 25
Plan Total Prescribed Dose: 45 Gy
Reference Point Dosage Given to Date: 41.4 Gy
Reference Point Session Dosage Given: 1.8 Gy
Session Number: 23

## 2021-09-19 ENCOUNTER — Ambulatory Visit
Admission: RE | Admit: 2021-09-19 | Discharge: 2021-09-19 | Disposition: A | Discharge status: Home / Self Care | Payer: Medicare HMO | Source: Ambulatory Visit | Attending: Radiation Oncology | Admitting: Radiation Oncology

## 2021-09-19 ENCOUNTER — Other Ambulatory Visit: Payer: Self-pay

## 2021-09-19 DIAGNOSIS — C61 Malignant neoplasm of prostate: Secondary | ICD-10-CM | POA: Diagnosis not present

## 2021-09-19 DIAGNOSIS — Z191 Hormone sensitive malignancy status: Secondary | ICD-10-CM | POA: Diagnosis not present

## 2021-09-19 DIAGNOSIS — Z510 Encounter for antineoplastic radiation therapy: Secondary | ICD-10-CM | POA: Diagnosis not present

## 2021-09-19 LAB — RAD ONC ARIA SESSION SUMMARY
Course Elapsed Days: 33
Plan Fractions Treated to Date: 24
Plan Prescribed Dose Per Fraction: 1.8 Gy
Plan Total Fractions Prescribed: 25
Plan Total Prescribed Dose: 45 Gy
Reference Point Dosage Given to Date: 43.2 Gy
Reference Point Session Dosage Given: 1.8 Gy
Session Number: 24

## 2021-09-20 ENCOUNTER — Other Ambulatory Visit: Payer: Self-pay

## 2021-09-20 ENCOUNTER — Ambulatory Visit
Admission: RE | Admit: 2021-09-20 | Discharge: 2021-09-20 | Disposition: A | Discharge status: Home / Self Care | Payer: Medicare HMO | Source: Ambulatory Visit | Attending: Radiation Oncology | Admitting: Radiation Oncology

## 2021-09-20 DIAGNOSIS — C61 Malignant neoplasm of prostate: Secondary | ICD-10-CM | POA: Diagnosis not present

## 2021-09-20 DIAGNOSIS — Z510 Encounter for antineoplastic radiation therapy: Secondary | ICD-10-CM | POA: Diagnosis not present

## 2021-09-20 DIAGNOSIS — Z191 Hormone sensitive malignancy status: Secondary | ICD-10-CM | POA: Diagnosis not present

## 2021-09-20 LAB — RAD ONC ARIA SESSION SUMMARY
Course Elapsed Days: 34
Plan Fractions Treated to Date: 25
Plan Prescribed Dose Per Fraction: 1.8 Gy
Plan Total Fractions Prescribed: 25
Plan Total Prescribed Dose: 45 Gy
Reference Point Dosage Given to Date: 45 Gy
Reference Point Session Dosage Given: 1.8 Gy
Session Number: 25

## 2021-09-21 ENCOUNTER — Other Ambulatory Visit: Payer: Self-pay

## 2021-09-21 ENCOUNTER — Ambulatory Visit
Admission: RE | Admit: 2021-09-21 | Discharge: 2021-09-21 | Disposition: A | Discharge status: Home / Self Care | Payer: Medicare HMO | Source: Ambulatory Visit | Attending: Radiation Oncology | Admitting: Radiation Oncology

## 2021-09-21 DIAGNOSIS — Z510 Encounter for antineoplastic radiation therapy: Secondary | ICD-10-CM | POA: Diagnosis not present

## 2021-09-21 DIAGNOSIS — Z191 Hormone sensitive malignancy status: Secondary | ICD-10-CM | POA: Diagnosis not present

## 2021-09-21 DIAGNOSIS — C61 Malignant neoplasm of prostate: Secondary | ICD-10-CM | POA: Diagnosis not present

## 2021-09-21 LAB — RAD ONC ARIA SESSION SUMMARY
Course Elapsed Days: 35
Plan Fractions Treated to Date: 1
Plan Prescribed Dose Per Fraction: 2 Gy
Plan Total Fractions Prescribed: 15
Plan Total Prescribed Dose: 30 Gy
Reference Point Dosage Given to Date: 47 Gy
Reference Point Session Dosage Given: 2 Gy
Session Number: 26

## 2021-09-22 ENCOUNTER — Other Ambulatory Visit: Payer: Self-pay

## 2021-09-22 ENCOUNTER — Ambulatory Visit
Admission: RE | Admit: 2021-09-22 | Discharge: 2021-09-22 | Disposition: A | Discharge status: Home / Self Care | Payer: Medicare HMO | Source: Ambulatory Visit | Attending: Radiation Oncology | Admitting: Radiation Oncology

## 2021-09-22 DIAGNOSIS — Z510 Encounter for antineoplastic radiation therapy: Secondary | ICD-10-CM | POA: Diagnosis not present

## 2021-09-22 DIAGNOSIS — Z191 Hormone sensitive malignancy status: Secondary | ICD-10-CM | POA: Diagnosis not present

## 2021-09-22 DIAGNOSIS — C61 Malignant neoplasm of prostate: Secondary | ICD-10-CM | POA: Diagnosis not present

## 2021-09-22 LAB — RAD ONC ARIA SESSION SUMMARY
Course Elapsed Days: 36
Plan Fractions Treated to Date: 2
Plan Prescribed Dose Per Fraction: 2 Gy
Plan Total Fractions Prescribed: 15
Plan Total Prescribed Dose: 30 Gy
Reference Point Dosage Given to Date: 49 Gy
Reference Point Session Dosage Given: 2 Gy
Session Number: 27

## 2021-09-25 ENCOUNTER — Ambulatory Visit
Admission: RE | Admit: 2021-09-25 | Discharge: 2021-09-25 | Disposition: A | Discharge status: Home / Self Care | Payer: Medicare HMO | Source: Ambulatory Visit | Attending: Radiation Oncology | Admitting: Radiation Oncology

## 2021-09-25 ENCOUNTER — Other Ambulatory Visit: Payer: Self-pay

## 2021-09-25 DIAGNOSIS — Z191 Hormone sensitive malignancy status: Secondary | ICD-10-CM | POA: Diagnosis not present

## 2021-09-25 DIAGNOSIS — C61 Malignant neoplasm of prostate: Secondary | ICD-10-CM | POA: Diagnosis not present

## 2021-09-25 DIAGNOSIS — Z510 Encounter for antineoplastic radiation therapy: Secondary | ICD-10-CM | POA: Diagnosis not present

## 2021-09-25 LAB — RAD ONC ARIA SESSION SUMMARY
Course Elapsed Days: 39
Plan Fractions Treated to Date: 3
Plan Prescribed Dose Per Fraction: 2 Gy
Plan Total Fractions Prescribed: 15
Plan Total Prescribed Dose: 30 Gy
Reference Point Dosage Given to Date: 51 Gy
Reference Point Session Dosage Given: 2 Gy
Session Number: 28

## 2021-09-26 ENCOUNTER — Ambulatory Visit
Admission: RE | Admit: 2021-09-26 | Discharge: 2021-09-26 | Disposition: A | Discharge status: Home / Self Care | Payer: Medicare HMO | Source: Ambulatory Visit | Attending: Radiation Oncology | Admitting: Radiation Oncology

## 2021-09-26 ENCOUNTER — Other Ambulatory Visit: Payer: Self-pay

## 2021-09-26 DIAGNOSIS — Z191 Hormone sensitive malignancy status: Secondary | ICD-10-CM | POA: Diagnosis not present

## 2021-09-26 DIAGNOSIS — C61 Malignant neoplasm of prostate: Secondary | ICD-10-CM | POA: Diagnosis not present

## 2021-09-26 DIAGNOSIS — Z510 Encounter for antineoplastic radiation therapy: Secondary | ICD-10-CM | POA: Diagnosis not present

## 2021-09-26 LAB — RAD ONC ARIA SESSION SUMMARY
Course Elapsed Days: 40
Plan Fractions Treated to Date: 4
Plan Prescribed Dose Per Fraction: 2 Gy
Plan Total Fractions Prescribed: 15
Plan Total Prescribed Dose: 30 Gy
Reference Point Dosage Given to Date: 53 Gy
Reference Point Session Dosage Given: 2 Gy
Session Number: 29

## 2021-09-27 ENCOUNTER — Other Ambulatory Visit: Payer: Self-pay

## 2021-09-27 ENCOUNTER — Ambulatory Visit
Admission: RE | Admit: 2021-09-27 | Discharge: 2021-09-27 | Disposition: A | Discharge status: Home / Self Care | Payer: Medicare HMO | Source: Ambulatory Visit | Attending: Radiation Oncology | Admitting: Radiation Oncology

## 2021-09-27 ENCOUNTER — Ambulatory Visit: Payer: Medicare HMO | Admitting: Nurse Practitioner

## 2021-09-27 DIAGNOSIS — C61 Malignant neoplasm of prostate: Secondary | ICD-10-CM | POA: Diagnosis not present

## 2021-09-27 DIAGNOSIS — Z510 Encounter for antineoplastic radiation therapy: Secondary | ICD-10-CM | POA: Diagnosis not present

## 2021-09-27 DIAGNOSIS — Z191 Hormone sensitive malignancy status: Secondary | ICD-10-CM | POA: Diagnosis not present

## 2021-09-27 LAB — RAD ONC ARIA SESSION SUMMARY
Course Elapsed Days: 41
Plan Fractions Treated to Date: 5
Plan Prescribed Dose Per Fraction: 2 Gy
Plan Total Fractions Prescribed: 15
Plan Total Prescribed Dose: 30 Gy
Reference Point Dosage Given to Date: 55 Gy
Reference Point Session Dosage Given: 2 Gy
Session Number: 30

## 2021-09-28 ENCOUNTER — Ambulatory Visit
Admission: RE | Admit: 2021-09-28 | Discharge: 2021-09-28 | Disposition: A | Discharge status: Home / Self Care | Payer: Medicare HMO | Source: Ambulatory Visit | Attending: Radiation Oncology | Admitting: Radiation Oncology

## 2021-09-28 ENCOUNTER — Other Ambulatory Visit: Payer: Self-pay

## 2021-09-28 DIAGNOSIS — Z510 Encounter for antineoplastic radiation therapy: Secondary | ICD-10-CM | POA: Diagnosis not present

## 2021-09-28 DIAGNOSIS — C61 Malignant neoplasm of prostate: Secondary | ICD-10-CM | POA: Diagnosis not present

## 2021-09-28 DIAGNOSIS — Z191 Hormone sensitive malignancy status: Secondary | ICD-10-CM | POA: Diagnosis not present

## 2021-09-28 LAB — RAD ONC ARIA SESSION SUMMARY
Course Elapsed Days: 42
Plan Fractions Treated to Date: 6
Plan Prescribed Dose Per Fraction: 2 Gy
Plan Total Fractions Prescribed: 15
Plan Total Prescribed Dose: 30 Gy
Reference Point Dosage Given to Date: 57 Gy
Reference Point Session Dosage Given: 2 Gy
Session Number: 31

## 2021-09-29 ENCOUNTER — Other Ambulatory Visit: Payer: Self-pay

## 2021-09-29 ENCOUNTER — Ambulatory Visit
Admission: RE | Admit: 2021-09-29 | Discharge: 2021-09-29 | Disposition: A | Discharge status: Home / Self Care | Payer: Medicare HMO | Source: Ambulatory Visit | Attending: Radiation Oncology | Admitting: Radiation Oncology

## 2021-09-29 DIAGNOSIS — C61 Malignant neoplasm of prostate: Secondary | ICD-10-CM | POA: Diagnosis not present

## 2021-09-29 DIAGNOSIS — Z510 Encounter for antineoplastic radiation therapy: Secondary | ICD-10-CM | POA: Diagnosis not present

## 2021-09-29 DIAGNOSIS — Z191 Hormone sensitive malignancy status: Secondary | ICD-10-CM | POA: Diagnosis not present

## 2021-09-29 LAB — RAD ONC ARIA SESSION SUMMARY
Course Elapsed Days: 43
Plan Fractions Treated to Date: 7
Plan Prescribed Dose Per Fraction: 2 Gy
Plan Total Fractions Prescribed: 15
Plan Total Prescribed Dose: 30 Gy
Reference Point Dosage Given to Date: 59 Gy
Reference Point Session Dosage Given: 2 Gy
Session Number: 32

## 2021-10-02 ENCOUNTER — Ambulatory Visit
Admission: RE | Admit: 2021-10-02 | Discharge: 2021-10-02 | Disposition: A | Discharge status: Home / Self Care | Payer: Medicare HMO | Source: Ambulatory Visit | Attending: Radiation Oncology | Admitting: Radiation Oncology

## 2021-10-02 ENCOUNTER — Other Ambulatory Visit: Payer: Self-pay

## 2021-10-02 DIAGNOSIS — Z510 Encounter for antineoplastic radiation therapy: Secondary | ICD-10-CM | POA: Diagnosis not present

## 2021-10-02 DIAGNOSIS — C61 Malignant neoplasm of prostate: Secondary | ICD-10-CM | POA: Diagnosis not present

## 2021-10-02 DIAGNOSIS — Z191 Hormone sensitive malignancy status: Secondary | ICD-10-CM | POA: Diagnosis not present

## 2021-10-02 LAB — RAD ONC ARIA SESSION SUMMARY
Course Elapsed Days: 46
Plan Fractions Treated to Date: 8
Plan Prescribed Dose Per Fraction: 2 Gy
Plan Total Fractions Prescribed: 15
Plan Total Prescribed Dose: 30 Gy
Reference Point Dosage Given to Date: 61 Gy
Reference Point Session Dosage Given: 2 Gy
Session Number: 33

## 2021-10-03 ENCOUNTER — Ambulatory Visit
Admission: RE | Admit: 2021-10-03 | Discharge: 2021-10-03 | Disposition: A | Discharge status: Home / Self Care | Payer: Medicare HMO | Source: Ambulatory Visit | Attending: Radiation Oncology | Admitting: Radiation Oncology

## 2021-10-03 ENCOUNTER — Other Ambulatory Visit: Payer: Self-pay

## 2021-10-03 DIAGNOSIS — C61 Malignant neoplasm of prostate: Secondary | ICD-10-CM | POA: Diagnosis not present

## 2021-10-03 DIAGNOSIS — Z191 Hormone sensitive malignancy status: Secondary | ICD-10-CM | POA: Diagnosis not present

## 2021-10-03 DIAGNOSIS — Z510 Encounter for antineoplastic radiation therapy: Secondary | ICD-10-CM | POA: Diagnosis not present

## 2021-10-03 LAB — RAD ONC ARIA SESSION SUMMARY
Course Elapsed Days: 47
Plan Fractions Treated to Date: 9
Plan Prescribed Dose Per Fraction: 2 Gy
Plan Total Fractions Prescribed: 15
Plan Total Prescribed Dose: 30 Gy
Reference Point Dosage Given to Date: 63 Gy
Reference Point Session Dosage Given: 2 Gy
Session Number: 34

## 2021-10-04 ENCOUNTER — Other Ambulatory Visit: Payer: Self-pay

## 2021-10-04 ENCOUNTER — Ambulatory Visit
Admission: RE | Admit: 2021-10-04 | Discharge: 2021-10-04 | Disposition: A | Discharge status: Home / Self Care | Payer: Medicare HMO | Source: Ambulatory Visit | Attending: Radiation Oncology | Admitting: Radiation Oncology

## 2021-10-04 DIAGNOSIS — C61 Malignant neoplasm of prostate: Secondary | ICD-10-CM | POA: Diagnosis not present

## 2021-10-04 DIAGNOSIS — Z510 Encounter for antineoplastic radiation therapy: Secondary | ICD-10-CM | POA: Diagnosis not present

## 2021-10-04 DIAGNOSIS — Z191 Hormone sensitive malignancy status: Secondary | ICD-10-CM | POA: Diagnosis not present

## 2021-10-04 LAB — RAD ONC ARIA SESSION SUMMARY
Course Elapsed Days: 48
Plan Fractions Treated to Date: 10
Plan Prescribed Dose Per Fraction: 2 Gy
Plan Total Fractions Prescribed: 15
Plan Total Prescribed Dose: 30 Gy
Reference Point Dosage Given to Date: 65 Gy
Reference Point Session Dosage Given: 2 Gy
Session Number: 35

## 2021-10-05 ENCOUNTER — Other Ambulatory Visit: Payer: Self-pay

## 2021-10-05 ENCOUNTER — Ambulatory Visit
Admission: RE | Admit: 2021-10-05 | Discharge: 2021-10-05 | Disposition: A | Discharge status: Home / Self Care | Payer: Medicare HMO | Source: Ambulatory Visit | Attending: Radiation Oncology | Admitting: Radiation Oncology

## 2021-10-05 DIAGNOSIS — Z191 Hormone sensitive malignancy status: Secondary | ICD-10-CM | POA: Diagnosis not present

## 2021-10-05 DIAGNOSIS — C61 Malignant neoplasm of prostate: Secondary | ICD-10-CM | POA: Diagnosis not present

## 2021-10-05 DIAGNOSIS — Z510 Encounter for antineoplastic radiation therapy: Secondary | ICD-10-CM | POA: Diagnosis not present

## 2021-10-05 LAB — RAD ONC ARIA SESSION SUMMARY
Course Elapsed Days: 49
Plan Fractions Treated to Date: 11
Plan Prescribed Dose Per Fraction: 2 Gy
Plan Total Fractions Prescribed: 15
Plan Total Prescribed Dose: 30 Gy
Reference Point Dosage Given to Date: 67 Gy
Reference Point Session Dosage Given: 2 Gy
Session Number: 36

## 2021-10-06 ENCOUNTER — Ambulatory Visit
Admission: RE | Admit: 2021-10-06 | Discharge: 2021-10-06 | Disposition: A | Discharge status: Home / Self Care | Payer: Medicare HMO | Source: Ambulatory Visit | Attending: Radiation Oncology | Admitting: Radiation Oncology

## 2021-10-06 ENCOUNTER — Other Ambulatory Visit: Payer: Self-pay

## 2021-10-06 DIAGNOSIS — Z510 Encounter for antineoplastic radiation therapy: Secondary | ICD-10-CM | POA: Diagnosis not present

## 2021-10-06 DIAGNOSIS — Z191 Hormone sensitive malignancy status: Secondary | ICD-10-CM | POA: Diagnosis not present

## 2021-10-06 DIAGNOSIS — C61 Malignant neoplasm of prostate: Secondary | ICD-10-CM | POA: Diagnosis not present

## 2021-10-06 LAB — RAD ONC ARIA SESSION SUMMARY
Course Elapsed Days: 50
Plan Fractions Treated to Date: 12
Plan Prescribed Dose Per Fraction: 2 Gy
Plan Total Fractions Prescribed: 15
Plan Total Prescribed Dose: 30 Gy
Reference Point Dosage Given to Date: 69 Gy
Reference Point Session Dosage Given: 2 Gy
Session Number: 37

## 2021-10-10 ENCOUNTER — Other Ambulatory Visit: Payer: Self-pay

## 2021-10-10 ENCOUNTER — Ambulatory Visit
Admission: RE | Admit: 2021-10-10 | Discharge: 2021-10-10 | Disposition: A | Discharge status: Home / Self Care | Payer: Medicare HMO | Source: Ambulatory Visit | Attending: Radiation Oncology | Admitting: Radiation Oncology

## 2021-10-10 DIAGNOSIS — Z191 Hormone sensitive malignancy status: Secondary | ICD-10-CM | POA: Diagnosis not present

## 2021-10-10 DIAGNOSIS — Z510 Encounter for antineoplastic radiation therapy: Secondary | ICD-10-CM | POA: Diagnosis not present

## 2021-10-10 DIAGNOSIS — C61 Malignant neoplasm of prostate: Secondary | ICD-10-CM | POA: Diagnosis not present

## 2021-10-10 LAB — RAD ONC ARIA SESSION SUMMARY
Course Elapsed Days: 54
Plan Fractions Treated to Date: 13
Plan Prescribed Dose Per Fraction: 2 Gy
Plan Total Fractions Prescribed: 15
Plan Total Prescribed Dose: 30 Gy
Reference Point Dosage Given to Date: 71 Gy
Reference Point Session Dosage Given: 2 Gy
Session Number: 38

## 2021-10-11 ENCOUNTER — Ambulatory Visit
Admission: RE | Admit: 2021-10-11 | Discharge: 2021-10-11 | Disposition: A | Discharge status: Home / Self Care | Payer: Medicare HMO | Source: Ambulatory Visit | Attending: Radiation Oncology | Admitting: Radiation Oncology

## 2021-10-11 ENCOUNTER — Other Ambulatory Visit: Payer: Self-pay

## 2021-10-11 DIAGNOSIS — Z191 Hormone sensitive malignancy status: Secondary | ICD-10-CM | POA: Diagnosis not present

## 2021-10-11 DIAGNOSIS — Z510 Encounter for antineoplastic radiation therapy: Secondary | ICD-10-CM | POA: Diagnosis not present

## 2021-10-11 DIAGNOSIS — C61 Malignant neoplasm of prostate: Secondary | ICD-10-CM | POA: Diagnosis not present

## 2021-10-11 LAB — RAD ONC ARIA SESSION SUMMARY
Course Elapsed Days: 55
Plan Fractions Treated to Date: 14
Plan Prescribed Dose Per Fraction: 2 Gy
Plan Total Fractions Prescribed: 15
Plan Total Prescribed Dose: 30 Gy
Reference Point Dosage Given to Date: 73 Gy
Reference Point Session Dosage Given: 2 Gy
Session Number: 39

## 2021-10-12 ENCOUNTER — Other Ambulatory Visit: Payer: Self-pay

## 2021-10-12 ENCOUNTER — Ambulatory Visit
Admission: RE | Admit: 2021-10-12 | Discharge: 2021-10-12 | Disposition: A | Discharge status: Home / Self Care | Payer: Medicare HMO | Source: Ambulatory Visit | Attending: Radiation Oncology | Admitting: Radiation Oncology

## 2021-10-12 ENCOUNTER — Ambulatory Visit: Payer: Medicare HMO

## 2021-10-12 ENCOUNTER — Encounter: Payer: Self-pay | Admitting: Urology

## 2021-10-12 DIAGNOSIS — C61 Malignant neoplasm of prostate: Secondary | ICD-10-CM | POA: Insufficient documentation

## 2021-10-12 DIAGNOSIS — Z510 Encounter for antineoplastic radiation therapy: Secondary | ICD-10-CM | POA: Diagnosis not present

## 2021-10-12 DIAGNOSIS — Z191 Hormone sensitive malignancy status: Secondary | ICD-10-CM | POA: Diagnosis not present

## 2021-10-12 LAB — RAD ONC ARIA SESSION SUMMARY
Course Elapsed Days: 56
Plan Fractions Treated to Date: 15
Plan Prescribed Dose Per Fraction: 2 Gy
Plan Total Fractions Prescribed: 15
Plan Total Prescribed Dose: 30 Gy
Reference Point Dosage Given to Date: 75 Gy
Reference Point Session Dosage Given: 2 Gy
Session Number: 40

## 2021-11-20 ENCOUNTER — Other Ambulatory Visit: Payer: Self-pay

## 2021-11-20 DIAGNOSIS — I10 Essential (primary) hypertension: Secondary | ICD-10-CM

## 2021-11-20 MED ORDER — OLMESARTAN MEDOXOMIL 20 MG PO TABS: ORAL_TABLET | ORAL | 0 refills | Status: DC

## 2021-11-23 DIAGNOSIS — C61 Malignant neoplasm of prostate: Secondary | ICD-10-CM | POA: Diagnosis not present

## 2021-11-28 ENCOUNTER — Encounter: Payer: Self-pay | Admitting: Urology

## 2021-11-28 NOTE — Progress Notes (Signed)
Telephone appointment. I spoke w/ patient's brother/caretaker, Mr. Karmelo Bass, verified his identity and began nursing interview. Mr. [name] reports patient is well. No issues at this time.  Meaningful use complete. I-PSS score of 2-mild. No urinary management medications. Urology appt- July, 28th, 2023  Reminded Mr. [name] of patient's 9:00am-11/29/21 telephone appointment w/ Freeman Caldron PA-C. I left my extension [phone] in case patient needs anything. Understanding verbalized.  Patient contact [phone]- Caretaker [name]

## 2021-11-29 ENCOUNTER — Telehealth: Payer: Self-pay | Admitting: Adult Health

## 2021-11-29 ENCOUNTER — Ambulatory Visit
Admission: RE | Admit: 2021-11-29 | Discharge: 2021-11-29 | Disposition: A | Discharge status: Home / Self Care | Payer: Medicare HMO | Source: Ambulatory Visit | Attending: Urology | Admitting: Urology

## 2021-11-29 DIAGNOSIS — C61 Malignant neoplasm of prostate: Secondary | ICD-10-CM | POA: Insufficient documentation

## 2021-11-29 NOTE — Telephone Encounter (Signed)
[facility] PER phone req from [place], pt brother is aware

## 2021-11-29 NOTE — Progress Notes (Signed)
  Radiation Oncology         [phone] ________________________________  Name: Christopher Watson MRN: [id number]  Date: 10/12/2021  DOB: 01/13/1966  End of Treatment Note  Diagnosis:   56 y.o. gentleman with Stage T2c adenocarcinoma of the prostate with Gleason score of 4+4, and PSA of 5.83.     Indication for treatment:  Curative, Definitive Radiotherapy       Radiation treatment dates:   08/17/21 - 10/12/21  Site/dose:  1. The prostate, seminal vesicles, and pelvic lymph nodes were initially treated to 45 Gy in 25 fractions of 1.8 Gy  2. The prostate only was boosted to 75 Gy with 15 additional fractions of 2.0 Gy   Beams/energy:  1. The prostate, seminal vesicles, and pelvic lymph nodes were initially treated using VMAT intensity modulated radiotherapy delivering 6 megavolt photons. Image guidance was performed with CB-CT studies prior to each fraction. He was immobilized with a body fix lower extremity mold.  2. the prostate only was boosted using VMAT intensity modulated radiotherapy delivering 6 megavolt photons. Image guidance was performed with CB-CT studies prior to each fraction. He was immobilized with a body fix lower extremity mold.  Narrative: The patient tolerated radiation treatment relatively well with only minor urinary irritation and modest fatigue.   Plan: The patient has completed radiation treatment. He will return to radiation oncology clinic for routine followup in one month. I advised him to call or return sooner if he has any questions or concerns related to his recovery or treatment. ________________________________  Sheral Apley. Tammi Klippel, M.D.

## 2021-11-29 NOTE — Progress Notes (Signed)
  Radiation Oncology         [phone] ________________________________  Name: Christopher Watson MRN: [id number]  Date: 11/29/2021  DOB: 08/18/1965  Post Treatment Note  CC: Christopher Brine, FNP  Christopher Aloe, MD  Diagnosis:   56 y.o. mentally handicapped gentleman with Stage T2c adenocarcinoma of the prostate with Gleason score of 4+4, and PSA of 5.83.  Interval Since Last Radiation:  6.5 weeks  08/17/21 - 10/12/21: (concurrent with LT-ADT- started 06/07/21) 1. The prostate, seminal vesicles, and pelvic lymph nodes were initially treated to 45 Gy in 25 fractions of 1.8 Gy  2. The prostate only was boosted to 75 Gy with 15 additional fractions of 2.0 Gy   Narrative:  I spoke with the patient to conduct his routine scheduled 1 month follow up visit via telephone to spare the patient unnecessary potential exposure in the healthcare setting during the current COVID-19 pandemic.  The patient was notified in advance and gave permission to proceed with this visit format.  He tolerated radiation treatment relatively well with only minor urinary irritation and modest fatigue.                               On review of systems, obtained through Christopher Watson, the patient's brother and caregiver, the patient states that he is doing very well in general and currently without complaints.  He tolerated the treatments very well and has not had any issues with dysuria, gross hematuria, excessive urinary frequency, urgency, straining to void, incomplete emptying or incontinence.  He has maintained a healthy appetite and is maintaining his weight.  They have not noticed any significant change in his energy level and he seems to be tolerating the ADT well also.  Overall, they are quite pleased with his progress to date.  ALLERGIES:  is allergic to bee venom.  Meds: Current Outpatient Medications  Medication Sig Dispense Refill   olmesartan (BENICAR) 20 MG tablet TAKE 1 TABLET(20 MG) BY MOUTH DAILY 30  tablet 0   No current facility-administered medications for this encounter.    Physical Findings:  vitals were not taken for this visit.  Pain Assessment Pain Score: 0-No pain/10 Unable to assess due to telephone follow-up visit format.  Lab Findings: Lab Results  Component Value Date   WBC 6.0 05/30/2021   HGB 15.0 08/03/2021   HCT 44.0 08/03/2021   MCV 88 05/30/2021   PLT 221 05/30/2021     Radiographic Findings: No results found.  Impression/Plan: 57. 56 y.o. mentally handicapped gentleman with Stage T2c adenocarcinoma of the prostate with Gleason score of 4+4, and PSA of 5.83. He will continue to follow up with urology for ongoing PSA determinations and had a follow-up appointment with Dr. Dr. Junious Watson on 11/23/2021 and is scheduled for his next 66-monthEligard injection on 12/08/2021. He understands what to expect with regards to PSA monitoring going forward. I will look forward to following his response to treatment via correspondence with urology, and would be happy to continue to participate in his care if clinically indicated. I talked to the patient about what to expect in the future, including his risk for erectile dysfunction and rectal bleeding. I encouraged him to call or return to the office if he has any questions regarding his previous radiation or possible radiation side effects. He was comfortable with this plan and will follow up as needed.     ANicholos Johns PA-C

## 2021-12-05 ENCOUNTER — Ambulatory Visit (INDEPENDENT_AMBULATORY_CARE_PROVIDER_SITE_OTHER): Payer: Medicare HMO | Admitting: Nurse Practitioner

## 2021-12-05 ENCOUNTER — Encounter: Payer: Self-pay | Admitting: Nurse Practitioner

## 2021-12-05 VITALS — BP 128/70 | HR 91 | Temp 98.1°F | Ht 68.0 in | Wt 154.0 lb

## 2021-12-05 DIAGNOSIS — L609 Nail disorder, unspecified: Secondary | ICD-10-CM

## 2021-12-05 DIAGNOSIS — C61 Malignant neoplasm of prostate: Secondary | ICD-10-CM | POA: Diagnosis not present

## 2021-12-05 DIAGNOSIS — I10 Essential (primary) hypertension: Secondary | ICD-10-CM | POA: Diagnosis not present

## 2021-12-05 MED ORDER — OLMESARTAN MEDOXOMIL 20 MG PO TABS: ORAL_TABLET | ORAL | 1 refills | Status: DC

## 2021-12-05 NOTE — Progress Notes (Signed)
I,Christopher Watson,acting as a Education administrator for [employer], FNP.,have documented all relevant documentation on the behalf of Christopher Brine, FNP,as directed by  Christopher Brine, FNP while in the presence of Christopher Watson, [place].  Subjective:     Patient ID: Christopher Watson , male    DOB: 1965-08-21 , 56 y.o.   MRN: [id number]   Chief Complaint  Patient presents with   Hypertension    HPI  Pt here for bp follow up. He has no questions or concerns at this time. Newly diagnosed with prostate cancer in 2022. Pt is accompanied by his brother, Christopher Watson. He has completed his cancer treatment but is going two times a year for the testosterone blocker. He tolerated well according to his brother. Per his brother his dad felt the patient was having pain to his feet and would like to be checked.  BP Readings from Last 3 Encounters: 12/05/21 : 128/70 08/03/21 : (!) 140/103 06/14/21 : 138/80    Hypertension This is a chronic problem. The current episode started more than 1 year ago. The problem is controlled. Pertinent negatives include no anxiety, chest pain, headaches, palpitations or shortness of breath. There are no associated agents to hypertension. Risk factors for coronary artery disease include sedentary lifestyle and male gender. Past treatments include angiotensin blockers. There are no compliance problems.  There is no history of angina or kidney disease. There is no history of chronic renal disease.     Past Medical History:  Diagnosis Date   Hypertension    Mental disability    stuttering, learning disability father is legal guardian   Prostate cancer ([place])      Family History  Problem Relation Age of Onset   Hypertension Father    Prostate cancer Father      Current Outpatient Medications:    olmesartan (BENICAR) 20 MG tablet, TAKE 1 TABLET(20 MG) BY MOUTH DAILY, Disp: 90 tablet, Rfl: 1   Allergies  Allergen Reactions   Bee Venom Swelling     Review of Systems   Constitutional: Negative.   Respiratory: Negative.  Negative for shortness of breath.   Cardiovascular: Negative.  Negative for chest pain and palpitations.  Gastrointestinal: Negative.   Neurological: Negative.  Negative for headaches.     Today's Vitals   12/05/21 1553  BP: 128/70  Pulse: 91  Temp: 98.1 F (36.7 C)  TempSrc: Oral  Weight: 154 lb (69.9 kg)  Height: _0  (1.727 m)  PainSc: 0-No pain   Body mass index is 23.42 kg/m.   Objective:  Physical Exam Constitutional:      General: He is not in acute distress.    Appearance: Normal appearance.  Cardiovascular:     Rate and Rhythm: Normal rate and regular rhythm.     Pulses: Normal pulses.     Heart sounds: Normal heart sounds. No murmur heard. Pulmonary:     Effort: Pulmonary effort is normal. No respiratory distress.     Breath sounds: Normal breath sounds. No wheezing.  Skin:    General: Skin is warm and dry.     Capillary Refill: Capillary refill takes less than 2 seconds.     Comments: Nails to his toes are mildly thick and some appear embedded to the 5th metatarsal.   Neurological:     General: No focal deficit present.     Mental Status: He is alert.     Cranial Nerves: No cranial nerve deficit.     Motor: No weakness.  Comments: Patient has a developmental delay  Psychiatric:        Mood and Affect: Mood normal.        Behavior: Behavior normal.        Thought Content: Thought content normal.        Judgment: Judgment normal.         Assessment And Plan:     1. Essential hypertension Comments: Blood pressure is well controlled, continue current medications. Christopher check kidney fuctions - BMP8+EGFR - olmesartan (BENICAR) 20 MG tablet; TAKE 1 TABLET(20 MG) BY MOUTH DAILY  Dispense: 90 tablet; Refill: 1  2. Nail disorder Comments: Some nails to toes are thickened and appear to be embedded of 5th metatarsal bilateral. Christopher benefit from visit to podiatrist to clip nails - Ambulatory referral  to Podiatry  3. Malignant neoplasm of prostate ([place]) Comments: Overall doing well, treatments are done but recieving injection every 6 months     Patient was given opportunity to ask questions. Patient verbalized understanding of the plan and was able to repeat key elements of the plan. All questions were answered to their satisfaction.  Christopher Brine, FNP   I, Christopher Brine, FNP, have reviewed all documentation for this visit. The documentation on 12/05/21 for the exam, diagnosis, procedures, and orders are all accurate and complete.   IF YOU HAVE BEEN REFERRED TO A SPECIALIST, IT MAY TAKE 1-2 WEEKS TO SCHEDULE/PROCESS THE REFERRAL. IF YOU HAVE NOT HEARD FROM US/SPECIALIST IN TWO WEEKS, PLEASE GIVE [place] A CALL AT [phone].   THE PATIENT IS ENCOURAGED TO PRACTICE SOCIAL DISTANCING DUE TO THE COVID-19 PANDEMIC.

## 2021-12-05 NOTE — Patient Instructions (Signed)

## 2021-12-06 LAB — BMP8+EGFR
BUN/Creatinine Ratio: 17 (ref 9–20)
BUN: 13 mg/dL (ref 6–24)
CO2: 26 mmol/L (ref 20–29)
Calcium: 9.9 mg/dL (ref 8.7–10.2)
Chloride: 104 mmol/L (ref 96–106)
Creatinine, Ser: 0.76 mg/dL (ref 0.76–1.27)
Glucose: 95 mg/dL (ref 70–99)
Potassium: 4.2 mmol/L (ref 3.5–5.2)
Sodium: 143 mmol/L (ref 134–144)
eGFR: 106 mL/min/{1.73_m2} (ref >59)

## 2021-12-08 DIAGNOSIS — Z5111 Encounter for antineoplastic chemotherapy: Secondary | ICD-10-CM | POA: Diagnosis not present

## 2021-12-08 DIAGNOSIS — C61 Malignant neoplasm of prostate: Secondary | ICD-10-CM | POA: Diagnosis not present

## 2021-12-25 ENCOUNTER — Ambulatory Visit (INDEPENDENT_AMBULATORY_CARE_PROVIDER_SITE_OTHER): Payer: Medicare HMO | Admitting: Podiatry

## 2021-12-25 DIAGNOSIS — B351 Tinea unguium: Secondary | ICD-10-CM | POA: Diagnosis not present

## 2021-12-25 DIAGNOSIS — M79674 Pain in right toe(s): Secondary | ICD-10-CM

## 2021-12-25 DIAGNOSIS — M79675 Pain in left toe(s): Secondary | ICD-10-CM

## 2021-12-27 NOTE — Progress Notes (Signed)
Subjective:   Patient ID: Christopher Watson, male   DOB: 56 y.o.   MRN: [id number]   HPI Chief Complaint  Patient presents with   Foot Pain    Pt came in today for routine foot care, Nail trim    56 year old male presents with the above complaints.  States the nails are thickened discolored likely fungus.  No swelling redness or drainage.  No recent treatment.  He has difficulty trimming the nails himself.  No other concerns.    Review of Systems  All other systems reviewed and are negative.  Past Medical History:  Diagnosis Date   Hypertension    Mental disability    stuttering, learning disability father is legal guardian   Prostate cancer [facility])     Past Surgical History:  Procedure Laterality Date   COLONOSCOPY  01/28/2020   GOLD SEED IMPLANT N/A 08/03/2021   Procedure: GOLD SEED IMPLANT;  Surgeon: Franchot Gallo, MD;  Location: [facility];  Service: Urology;  Laterality: N/A;   Prostate Needle Biopsy  03/29/2021   SPACE OAR INSTILLATION N/A 08/03/2021   Procedure: SPACE OAR INSTILLATION;  Surgeon: Franchot Gallo, MD;  Location: [facility];  Service: Urology;  Laterality: N/A;     Current Outpatient Medications:    olmesartan (BENICAR) 20 MG tablet, TAKE 1 TABLET(20 MG) BY MOUTH DAILY, Disp: 90 tablet, Rfl: 1  Allergies  Allergen Reactions   Bee Venom Swelling          Objective:  Physical Exam  General: AAO x3, NAD  Dermatological: Nails are hypertrophic, dystrophic, brittle, discolored, elongated 10. No surrounding redness or drainage. Tenderness nails 1-5 bilaterally. No open lesions or pre-ulcerative lesions are identified today.  Vascular: Dorsalis Pedis artery and Posterior Tibial artery pedal pulses are palpable bilateral with immedate capillary fill time. There is no pain with calf compression, swelling, warmth, erythema.   Neruologic: Grossly intact via light touch bilateral.   Musculoskeletal: Tenderness to the  toenails but no other areas of discomfort.      Assessment:   Symptomatic onychomycosis     Plan:  -Treatment options discussed including all alternatives, risks, and complications -Etiology of symptoms were discussed -Nails debrided 10 without complications or bleeding. -Discussed treatment options for nail fungus including oral, topical as well as alternative treatments.  We will start with topical. -Daily foot inspection -Follow-up in 3 months or sooner if any problems arise. In the meantime, encouraged to call the office with any questions, concerns, change in symptoms.   Celesta Gentile, DPM

## 2022-01-08 NOTE — Progress Notes (Signed)
This encounter was created in error - please disregard.

## 2022-01-22 ENCOUNTER — Encounter: Payer: Self-pay | Admitting: *Deleted

## 2022-01-22 ENCOUNTER — Inpatient Hospital Stay: Payer: Medicare HMO | Attending: Adult Health | Admitting: Adult Health

## 2022-01-22 ENCOUNTER — Other Ambulatory Visit: Payer: Self-pay

## 2022-01-22 ENCOUNTER — Encounter: Payer: Self-pay | Admitting: Adult Health

## 2022-01-22 VITALS — BP 128/83 | HR 81 | Temp 97.9°F | Resp 16 | Ht 68.0 in | Wt 157.3 lb

## 2022-01-22 DIAGNOSIS — C775 Secondary and unspecified malignant neoplasm of intrapelvic lymph nodes: Secondary | ICD-10-CM | POA: Diagnosis not present

## 2022-01-22 DIAGNOSIS — C61 Malignant neoplasm of prostate: Secondary | ICD-10-CM | POA: Insufficient documentation

## 2022-01-22 NOTE — Progress Notes (Signed)
SURVIVORSHIP VISIT:    BRIEF ONCOLOGIC HISTORY:  Oncology History  Malignant neoplasm of prostate ([place])  05/17/2021 Initial Diagnosis   Malignant neoplasm of prostate ([place])   05/29/2021 Cancer Staging   Staging form: Prostate, AJCC 8th Edition - Clinical stage from 05/29/2021: Stage IIC (cT2c, cN0, cM0, PSA: 5.7, Grade Group: 4) - Signed by Tyler Pita, MD on 05/29/2021 Histopathologic type: Adenocarcinoma, NOS Stage prefix: Initial diagnosis Prostate specific antigen (PSA) range: Less than 10 Gleason primary pattern: 4 Gleason secondary pattern: 4 Gleason score: 8 Histologic grading system: 5 grade system Number of biopsy cores examined: 12 Number of biopsy cores positive: 10 Location of positive needle core biopsies: Both sides   08/17/2021 - 10/12/2021 Radiation Therapy    Site/dose:  1. The prostate, seminal vesicles, and pelvic lymph nodes were initially treated to 45 Gy in 25 fractions of 1.8 Gy  2. The prostate only was boosted to 75 Gy with 15 additional fractions of 2.0 Gy      INTERVAL HISTORY:  Mr. Frisbee to review his survivorship care plan detailing his treatment course for prostate cancer, as well as monitoring long-term side effects of that treatment, education regarding health maintenance, screening, and overall wellness and health promotion.     Overall, Mr. Cavanagh reports feeling quite well.  He is accompanied by his brother Will.  His brother informed me that their other brother who lives in [place] has also recently been diagnosed with prostate cancer.  REVIEW OF SYSTEMS:  Review of Systems  Constitutional:  Negative for appetite change, chills, fatigue, fever and unexpected weight change.  HENT:   Negative for hearing loss, lump/mass and trouble swallowing.   Eyes:  Negative for eye problems and icterus.  Respiratory:  Negative for chest tightness, cough and shortness of breath.   Cardiovascular:  Negative for chest pain, leg swelling and palpitations.   Gastrointestinal:  Negative for abdominal distention, abdominal pain, constipation, diarrhea, nausea and vomiting.  Endocrine: Negative for hot flashes.  Genitourinary:  Negative for difficulty urinating.   Musculoskeletal:  Negative for arthralgias.  Skin:  Negative for itching and rash.  Neurological:  Negative for dizziness, extremity weakness, headaches and numbness.  Hematological:  Negative for adenopathy. Does not bruise/bleed easily.  Psychiatric/Behavioral:  Negative for depression. The patient is not nervous/anxious.    ONCOLOGY TREATMENT TEAM:  1. Urologist: Dr. Junious Silk at [facility] 2. Radiation Oncologist: Dr. Tammi Klippel    PAST MEDICAL/SURGICAL HISTORY:  Past Medical History:  Diagnosis Date   Hypertension    Mental disability    stuttering, learning disability father is legal guardian   Prostate cancer [facility])    Past Surgical History:  Procedure Laterality Date   COLONOSCOPY  01/28/2020   GOLD SEED IMPLANT N/A 08/03/2021   Procedure: GOLD SEED IMPLANT;  Surgeon: Franchot Gallo, MD;  Location: [facility];  Service: Urology;  Laterality: N/A;   Prostate Needle Biopsy  03/29/2021   SPACE OAR INSTILLATION N/A 08/03/2021   Procedure: SPACE OAR INSTILLATION;  Surgeon: Franchot Gallo, MD;  Location[facility];  Service: Urology;  Laterality: N/A;     ALLERGIES:  Allergies  Allergen Reactions   Bee Venom Swelling     CURRENT MEDICATIONS:  Outpatient Encounter Medications as of 01/22/2022  Medication Sig   olmesartan (BENICAR) 20 MG tablet TAKE 1 TABLET(20 MG) BY MOUTH DAILY   No facility-administered encounter medications on file as of 01/22/2022.     ONCOLOGIC FAMILY HISTORY:  Family History  Problem Relation  Age of Onset   Hypertension Father    Prostate cancer Father      GENETIC COUNSELING/TESTING: I recommended this today to Raad's brother will who will discuss it with Valeriano's legal guardian who is his  father.  SOCIAL HISTORY:  Social History   Socioeconomic History   Marital status: Single    Spouse name: Not on file   Number of children: Not on file   Years of education: Not on file   Highest education level: Not on file  Occupational History   Occupation: unemployment  Tobacco Use   Smoking status: Never   Smokeless tobacco: Never  Vaping Use   Vaping Use: Never used  Substance and Sexual Activity   Alcohol use: No   Drug use: Not Currently   Sexual activity: Not Currently  Other Topics Concern   Not on file  Social History Narrative   Pt has legal guardian father alex Divis lives with patient with learning disability, father has poa but cannot find poa papers   Social Determinants of Health   Financial Resource Strain: Low Risk  (06/14/2021)   Overall Financial Resource Strain (CARDIA)    Difficulty of Paying Living Expenses: Not hard at all  Food Insecurity: No Food Insecurity (06/14/2021)   Hunger Vital Sign    Worried About Running Out of Food in the Last Year: Never true    [place] in the Last Year: Never true  Transportation Needs: No Transportation Needs (06/14/2021)   PRAPARE - Hydrologist (Medical): No    Lack of Transportation (Non-Medical): No  Physical Activity: Inactive (06/14/2021)   Exercise Vital Sign    Days of Exercise per Week: 0 days    Minutes of Exercise per Session: 0 min  Stress: No Stress Concern Present (06/14/2021)   [place]    Feeling of Stress : Not at all  Social Connections: Not on file  Intimate Partner Violence: Not At Risk (12/02/2018)   Humiliation, Afraid, Rape, and Kick questionnaire    Fear of Current or Ex-Partner: No    Emotionally Abused: No    Physically Abused: No    Sexually Abused: No     OBSERVATIONS/OBJECTIVE:  BP 128/83 (BP Location: Left Arm, Patient Position: Sitting)   Pulse 81   Temp 97.9 F (36.6 C)  (Temporal)   Resp 16   Ht '5\' 8"'$  (1.727 m)   Wt 157 lb 4.8 oz (71.4 kg)   SpO2 100%   BMI 23.92 kg/m  GENERAL: Patient is a well appearing male in no acute distress HEENT:  Sclerae anicteric.   LUNGS:  Clear to auscultation bilaterally.  No wheezes or rhonchi. HEART:  Regular rate and rhythm. No murmur appreciated. EXTREMITIES:  No peripheral edema.   SKIN:  Clear with no obvious rashes or skin changes. No nail dyscrasia.   LABORATORY DATA:  None for this visit.  DIAGNOSTIC IMAGING:  None for this visit.      ASSESSMENT AND PLAN:  Mr.. Moening is a pleasant 56 y.o. male with stage IIc prostate cancer grade group 4 treated with radiation and currently receiving Eligard.Marland Kitchen  He presents to the Survivorship Clinic for our initial meeting and routine follow-up post-completion of treatment for prostate cancer.    1.  Prostate cancer:  Mr. Porter is continuing to recover from definitive treatment for prostate cancer.  His most recent PSA was 0.53 consistent with a good treatment  response.  He will continue to follow-up with Dr. Junious Silk and urology for his PSA checks and Eligard therapy.  He is tolerating these treatments well and is not experiencing any common side effects that survivors can often finish after receiving radiation and with Eligard therapy. Today, a comprehensive survivorship care plan and treatment summary was reviewed with the patient and his brother today detailing his prostate cancer diagnosis, treatment course, potential late/long-term effects of treatment, appropriate follow-up care with recommendations for the future, and patient education resources.  A copy of this summary, along with a letter will be sent to the patient's primary care provider via mail/fax/In Basket message after today's visit.    2. Cancer screening:  Due to Mr. Kaatz's history and age, he should receive screening for skin cancers, colon cancer.  The information and recommendations are listed on the  patient's comprehensive care plan/treatment summary and were reviewed in detail with the patient.    3. Health maintenance and wellness promotion: Mr. Eller was encouraged to consume 5-7 servings of fruits and vegetables per day. We reviewed the "Nutrition Rainbow" handout.  He was also encouraged to engage in moderate to vigorous exercise for 30 minutes per day most days of the week. he was instructed to limit his alcohol consumption and continue to abstain from tobacco use.     #. Support services/counseling: he was given information regarding our available services and encouraged to contact me with any questions or for help enrolling in any of our support group/programs.    Follow up instructions:    -Return to cancer center as needed -Follow-up with Dr. Junious Silk at [facility] urology in January 2024.  The patient was provided an opportunity to ask questions and all were answered. The patient agreed with the plan and demonstrated an understanding of the instructions.   Total encounter time:30 minutes*in face-to-face visit time, chart review, lab review, care coordination, order entry, and documentation of the encounter time.    Wilber Bihari, NP 01/22/22 2:30 PM Medical Oncology and Hematology [facility], [place] [address] Tel. [phone]    Fax. [phone]  *Total Encounter Time as defined by the Centers for Medicare and Medicaid Services includes, in addition to the face-to-face time of a patient visit (documented in the note above) non-face-to-face time: obtaining and reviewing outside history, ordering and reviewing medications, tests or procedures, care coordination (communications with other health care professionals or caregivers) and documentation in the medical record.

## 2022-01-25 ENCOUNTER — Encounter: Payer: Self-pay | Admitting: Adult Health

## 2022-02-26 ENCOUNTER — Ambulatory Visit: Payer: Medicare HMO | Admitting: Podiatry

## 2022-02-26 DIAGNOSIS — B351 Tinea unguium: Secondary | ICD-10-CM | POA: Diagnosis not present

## 2022-02-26 DIAGNOSIS — M79675 Pain in left toe(s): Secondary | ICD-10-CM | POA: Diagnosis not present

## 2022-02-26 DIAGNOSIS — M79674 Pain in right toe(s): Secondary | ICD-10-CM

## 2022-03-01 NOTE — Progress Notes (Signed)
Subjective:   Patient ID: Christopher Watson, male   DOB: 56 y.o.   MRN: [id number]   HPI Chief Complaint  Patient presents with   Nail Problem    Routine Foot Care, Nail trim      56 year old male presents with the above complaints.  No new concerns otherwise.  No open lesions.      Objective:  Physical Exam  General: AAO x3, NAD  Dermatological: Nails are hypertrophic, dystrophic, brittle, discolored, elongated 10. No surrounding redness or drainage. Tenderness nails 1-5 bilaterally. No open lesions or pre-ulcerative lesions are identified today.  Vascular: Dorsalis Pedis artery and Posterior Tibial artery pedal pulses are palpable bilateral with immedate capillary fill time. There is no pain with calf compression, swelling, warmth, erythema.   Neruologic: Grossly intact via light touch bilateral.   Musculoskeletal: Tenderness to the toenails but no other areas of discomfort.      Assessment:   Symptomatic onychomycosis     Plan:  -Treatment options discussed including all alternatives, risks, and complications -Etiology of symptoms were discussed -Nails debrided 10 without complications or bleeding. -Daily foot inspection -Follow-up in 3 months or sooner if any problems arise. In the meantime, encouraged to call the office with any questions, concerns, change in symptoms.   Celesta Gentile, DPM

## 2022-05-31 ENCOUNTER — Encounter: Payer: Medicare HMO | Admitting: Nurse Practitioner

## 2022-06-27 ENCOUNTER — Ambulatory Visit: Payer: Medicare HMO

## 2022-06-27 ENCOUNTER — Ambulatory Visit (INDEPENDENT_AMBULATORY_CARE_PROVIDER_SITE_OTHER): Payer: Medicare HMO

## 2022-06-27 ENCOUNTER — Ambulatory Visit: Payer: Medicare HMO | Admitting: Nurse Practitioner

## 2022-06-27 VITALS — BP 110/70 | HR 94 | Temp 97.3°F | Ht 67.8 in | Wt 189.4 lb

## 2022-06-27 DIAGNOSIS — Z0000 Encounter for general adult medical examination without abnormal findings: Secondary | ICD-10-CM | POA: Diagnosis not present

## 2022-06-27 NOTE — Patient Instructions (Addendum)
Christopher Watson , Thank you for taking time to come for your Medicare Wellness Visit. I appreciate your ongoing commitment to your health goals. Please review the following plan we discussed and let me know if I can assist you in the future.   These are the goals we discussed:  Goals      Exercise 150 min/wk Moderate Activity     12/02/2018, wants to start exercising     Patient Stated     05/21/2019, no goals     Patient Stated     05/25/2020, no goals     Patient Stated     06/14/2021, no goals     Patient Stated     [facility]        This is a list of the screening recommended for you and due dates:  Health Maintenance  Topic Date Due   HIV Screening  Never done   DTaP/Tdap/Td vaccine (1 - Tdap) Never done   COVID-19 Vaccine (1) 07/13/2022*   Flu Shot  08/12/2022*   Zoster (Shingles) Vaccine (1 of 2) 09/25/2022*   Medicare Annual Wellness Visit  06/28/2023   Colon Cancer Screening  01/27/2030   Hepatitis C Screening: USPSTF Recommendation to screen - Ages 18-79 yo.  Completed   HPV Vaccine  Aged Out  *Topic was postponed. The date shown is not the original due date.    Advanced directives: Please bring a copy of your POA (Power of Attorney) and/or Living Will to your next appointment.    Conditions/risks identified: none  Next appointment: Follow up in one year for your annual wellness visit   Preventive Care 40-64 Years, Male Preventive care refers to lifestyle choices and visits with your health care provider that can promote health and wellness. What does preventive care include? A yearly physical exam. This is also called an annual well check. Dental exams once or twice a year. Routine eye exams. Ask your health care provider how often you should have your eyes checked. Personal lifestyle choices, including: Daily care of your teeth and gums. Regular physical activity. Eating a healthy diet. Avoiding tobacco and drug use. Limiting alcohol  use. Practicing safe sex. Taking low-dose aspirin every day starting at age 32. What happens during an annual well check? The services and screenings done by your health care provider during your annual well check will depend on your age, overall health, lifestyle risk factors, and family history of disease. Counseling  Your health care provider may ask you questions about your: Alcohol use. Tobacco use. Drug use. Emotional well-being. Home and relationship well-being. Sexual activity. Eating habits. Work and work Statistician. Screening  You may have the following tests or measurements: Height, weight, and BMI. Blood pressure. Lipid and cholesterol levels. These may be checked every 5 years, or more frequently if you are over 64 years old. Skin check. Lung cancer screening. You may have this screening every year starting at age 50 if you have a 30-pack-year history of smoking and currently smoke or have quit within the past 15 years. Fecal occult blood test (FOBT) of the stool. You may have this test every year starting at age 74. Flexible sigmoidoscopy or colonoscopy. You may have a sigmoidoscopy every 5 years or a colonoscopy every 10 years starting at age 59. Prostate cancer screening. Recommendations will vary depending on your family history and other risks. Hepatitis C blood test. Hepatitis B blood test. Sexually transmitted disease (STD) testing. Diabetes screening. This is  done by checking your blood sugar (glucose) after you have not eaten for a while (fasting). You may have this done every 1-3 years. Discuss your test results, treatment options, and if necessary, the need for more tests with your health care provider. Vaccines  Your health care provider may recommend certain vaccines, such as: Influenza vaccine. This is recommended every year. Tetanus, diphtheria, and acellular pertussis (Tdap, Td) vaccine. You may need a Td booster every 10 years. Zoster vaccine. You may  need this after age 63. Pneumococcal 13-valent conjugate (PCV13) vaccine. You may need this if you have certain conditions and have not been vaccinated. Pneumococcal polysaccharide (PPSV23) vaccine. You may need one or two doses if you smoke cigarettes or if you have certain conditions. Talk to your health care provider about which screenings and vaccines you need and how often you need them. This information is not intended to replace advice given to you by your health care provider. Make sure you discuss any questions you have with your health care provider. Document Released: 05/27/2015 Document Revised: 01/18/2016 Document Reviewed: 03/01/2015 Elsevier Interactive Patient Education  2017 [place] Prevention in the Home Falls can cause injuries. They can happen to people of all ages. There are many things you can do to make your home safe and to help prevent falls. What can I do on the outside of my home? Regularly fix the edges of walkways and driveways and fix any cracks. Remove anything that might make you trip as you walk through a door, such as a raised step or threshold. Trim any bushes or trees on the path to your home. Use bright outdoor lighting. Clear any walking paths of anything that might make someone trip, such as rocks or tools. Regularly check to see if handrails are loose or broken. Make sure that both sides of any steps have handrails. Any raised decks and porches should have guardrails on the edges. Have any leaves, snow, or ice cleared regularly. Use sand or salt on walking paths during winter. Clean up any spills in your garage right away. This includes oil or grease spills. What can I do in the bathroom? Use night lights. Install grab bars by the toilet and in the tub and shower. Do not use towel bars as grab bars. Use non-skid mats or decals in the tub or shower. If you need to sit down in the shower, use a plastic, non-slip stool. Keep the floor dry.  Clean up any water that spills on the floor as soon as it happens. Remove soap buildup in the tub or shower regularly. Attach bath mats securely with double-sided non-slip rug tape. Do not have throw rugs and other things on the floor that can make you trip. What can I do in the bedroom? Use night lights. Make sure that you have a light by your bed that is easy to reach. Do not use any sheets or blankets that are too big for your bed. They should not hang down onto the floor. Have a firm chair that has side arms. You can use this for support while you get dressed. Do not have throw rugs and other things on the floor that can make you trip. What can I do in the kitchen? Clean up any spills right away. Avoid walking on wet floors. Keep items that you use a lot in easy-to-reach places. If you need to reach something above you, use a strong step stool that has a grab bar. Keep  electrical cords out of the way. Do not use floor polish or wax that makes floors slippery. If you must use wax, use non-skid floor wax. Do not have throw rugs and other things on the floor that can make you trip. What can I do with my stairs? Do not leave any items on the stairs. Make sure that there are handrails on both sides of the stairs and use them. Fix handrails that are broken or loose. Make sure that handrails are as long as the stairways. Check any carpeting to make sure that it is firmly attached to the stairs. Fix any carpet that is loose or worn. Avoid having throw rugs at the top or bottom of the stairs. If you do have throw rugs, attach them to the floor with carpet tape. Make sure that you have a light switch at the top of the stairs and the bottom of the stairs. If you do not have them, ask someone to add them for you. What else can I do to help prevent falls? Wear shoes that: Do not have high heels. Have rubber bottoms. Are comfortable and fit you well. Are closed at the toe. Do not wear sandals. If  you use a stepladder: Make sure that it is fully opened. Do not climb a closed stepladder. Make sure that both sides of the stepladder are locked into place. Ask someone to hold it for you, if possible. Clearly mark and make sure that you can see: Any grab bars or handrails. First and last steps. Where the edge of each step is. Use tools that help you move around (mobility aids) if they are needed. These include: Canes. Walkers. Scooters. Crutches. Turn on the lights when you go into a dark area. Replace any light bulbs as soon as they burn out. Set up your furniture so you have a clear path. Avoid moving your furniture around. If any of your floors are uneven, fix them. If there are any pets around you, be aware of where they are. Review your medicines with your doctor. Some medicines can make you feel dizzy. This can increase your chance of falling. Ask your doctor what other things that you can do to help prevent falls. This information is not intended to replace advice given to you by your health care provider. Make sure you discuss any questions you have with your health care provider. Document Released: 02/24/2009 Document Revised: 10/06/2015 Document Reviewed: 06/04/2014 Elsevier Interactive Patient Education  2017 [employer].

## 2022-06-27 NOTE — Progress Notes (Signed)
Subjective:   Christopher Watson is a 57 y.o. male who presents for Medicare Annual/Subsequent preventive examination.  Review of Systems     Cardiac Risk Factors include: advanced age (>16mn, >>57women);hypertension;male gender     Objective:    Today's Vitals   06/27/22 1434  BP: 110/70  Pulse: 94  Temp: (!) 97.3 F (36.3 C)  TempSrc: Oral  SpO2: 94%  Weight: 189 lb 6.4 oz (85.9 kg)  Height: 5' 7.8" (1.722 m)   Body mass index is 28.97 kg/m.     06/27/2022    2:43 PM 11/28/2021    9:34 AM 08/03/2021    6:56 AM 06/14/2021    2:38 PM 05/29/2021   11:12 AM 05/25/2020   10:19 AM 05/21/2019   10:33 AM  Advanced Directives  Does Patient Have a Medical Advance Directive? Yes No Yes Yes No No No  Type of Advance Directive Healthcare [place]? No - copy requested  No - copy requested No - copy requested     Would patient like information on creating a medical advance directive?  Yes (ED - Information included in AVS)    No - Patient declined     Current Medications (verified) Outpatient Encounter Medications as of 06/27/2022  Medication Sig   olmesartan (BENICAR) 20 MG tablet TAKE 1 TABLET(20 MG) BY MOUTH DAILY   No facility-administered encounter medications on file as of 06/27/2022.    Allergies (verified) Bee venom   History: Past Medical History:  Diagnosis Date   Hypertension    Mental disability    stuttering, learning disability father is legal guardian   Prostate cancer ([facility]    Past Surgical History:  Procedure Laterality Date   COLONOSCOPY  01/28/2020   GOLD SEED IMPLANT N/A 08/03/2021   Procedure: GOLD SEED IMPLANT;  Surgeon: Christopher Gallo MD;  [facility]  Service: Urology;  Laterality: N/A;   Prostate Needle Biopsy  03/29/2021   SPACE OAR INSTILLATION N/A 08/03/2021   Procedure: SPACE OAR INSTILLATION;   Surgeon: Christopher Gallo MD;  [facility]  Service: Urology;  Laterality: N/A;   Family History  Problem Relation Age of Onset   Hypertension Father    Prostate cancer Father    Prostate cancer Brother    Social History   Socioeconomic History   Marital status: Single    Spouse name: Not on file   Number of children: Not on file   Years of education: Not on file   Highest education level: Not on file  Occupational History   Occupation: unemployment  Tobacco Use   Smoking status: Never   Smokeless tobacco: Never  Vaping Use   Vaping Use: Never used  Substance and Sexual Activity   Alcohol use: No   Drug use: Not Currently   Sexual activity: Not Currently  Other Topics Concern   Not on file  Social History Narrative   Pt has legal guardian father Christopher Watson lives with patient with learning disability, father has poa but cannot find poa papers   Social Determinants of Health   Financial Resource Strain: Low Risk  (06/27/2022)   Overall Financial Resource Strain (CARDIA)    Difficulty of Paying Living Expenses: Not hard at all  Food Insecurity: No Food Insecurity (06/27/2022)   Hunger Vital Sign    Worried About Running Out  of Food in the Last Year: Never true    [place] in the Last Year: Never true  Transportation Needs: No Transportation Needs (06/27/2022)   PRAPARE - Hydrologist (Medical): No    Lack of Transportation (Non-Medical): No  Physical Activity: Inactive (06/27/2022)   Exercise Vital Sign    Days of Exercise per Week: 0 days    Minutes of Exercise per Session: 0 min  Stress: No Stress Concern Present (06/27/2022)   [place]    Feeling of Stress : Not at all  Social Connections: Not on file    Tobacco Counseling Counseling given: Not Answered   Clinical Intake:  Pre-visit preparation completed: Yes  Pain : No/denies  pain     Nutritional Status: BMI 25 -29 Overweight Nutritional Risks: None Diabetes: No  How often do you need to have someone help you when you read instructions, pamphlets, or other written materials from your doctor or pharmacy?: 4 - Often  Diabetic? no  Interpreter Needed?: No  Information entered by :: NAllen LPN   Activities of Daily Living    06/27/2022    2:44 PM 08/03/2021    7:04 AM  In your present state of health, do you have any difficulty performing the following activities:  Hearing? 0 0  Vision? 0 0  Difficulty concentrating or making decisions? 1 1  Comment  due to mental disability  Walking or climbing stairs? 0 0  Dressing or bathing? 0 0  Doing errands, shopping? 1   Preparing Food and eating ? N   Using the Toilet? N   In the past six months, have you accidently leaked urine? N   Do you have problems with loss of bowel control? N   Managing your Medications? N   Managing your Finances? N   Housekeeping or managing your Housekeeping? N     Patient Care Team: Christopher Brine, FNP as PCP - General (General Practice) Christopher Pita, MD as Consulting Physician (Radiation Oncology) Christopher Aloe, MD as Consulting Physician (Urology)  Indicate any recent Medical Services you may have received from other than [facility] providers in the past year (date may be approximate).     Assessment:   This is a routine wellness examination for Christopher Watson.  Hearing/Vision screen Vision Screening - Comments:: No regular eye exams,  Dietary issues and exercise activities discussed: Current Exercise Habits: The patient does not participate in regular exercise at present   Goals Addressed             This Visit's Progress    Patient Stated       [facility]       Depression Screen    06/27/2022    2:44 PM 12/05/2021    4:03 PM 06/14/2021    2:39 PM 05/30/2021   10:06 AM 05/25/2020   10:20 AM 05/25/2020    9:56 AM 05/21/2019   10:34 AM  PHQ  2/9 Scores  PHQ - 2 Score 0 0 0 0 0 0 0  PHQ- 9 Score       0    Fall Risk    06/27/2022    2:44 PM 12/05/2021    4:03 PM 06/14/2021    2:39 PM 05/25/2020   10:20 AM 05/21/2019   10:34 AM  Fall Risk   Falls in the past year? 0 0 0 0 0  Number falls in  past yr: 0 0     Injury with Fall? 0 0     Risk for fall due to : Medication side effect No Fall Risks Medication side effect Medication side effect Medication side effect  Follow up Falls prevention discussed;Education provided;Falls evaluation completed Falls evaluation completed Falls evaluation completed;Education provided;Falls prevention discussed Falls evaluation completed;Education provided;Falls prevention discussed Falls evaluation completed;Education provided;Falls prevention discussed    FALL RISK PREVENTION PERTAINING TO THE HOME:  Any stairs in or around the home? Yes  If so, are there any without handrails? No  Home free of loose throw rugs in walkways, pet beds, electrical cords, etc? Yes  Adequate lighting in your home to reduce risk of falls? Yes   ASSISTIVE DEVICES UTILIZED TO PREVENT FALLS:  Life alert? No  Use of a cane, walker or w/c? No  Grab bars in the bathroom? No  Shower chair or bench in shower? No  Elevated toilet seat or a handicapped toilet? No   TIMED UP AND GO:  Was the test performed? Yes .  Length of time to ambulate 10 feet: 5 sec.   Gait steady and fast without use of assistive device  Cognitive Function:  6 CIT not administered. Patient has diagnosis of cognitive impairment.        12/02/2018   10:28 AM  6CIT Screen  What Year? 4 points  What month? 3 points  What time? 3 points  Count back from 20 4 points  Months in reverse 4 points  Repeat phrase 10 points  Total Score 28 points    Immunizations  There is no immunization history on file for this patient.  TDAP status: Due, Education has been provided regarding the importance of this vaccine. Advised may receive this vaccine  at local pharmacy or Health Dept. Aware to provide a copy of the vaccination record if obtained from local pharmacy or Health Dept. Verbalized acceptance and understanding.  Flu Vaccine status: Declined, Education has been provided regarding the importance of this vaccine but patient still declined. Advised may receive this vaccine at local pharmacy or Health Dept. Aware to provide a copy of the vaccination record if obtained from local pharmacy or Health Dept. Verbalized acceptance and understanding.  Pneumococcal vaccine status: Up to date  Covid-19 vaccine status: Declined, Education has been provided regarding the importance of this vaccine but patient still declined. Advised may receive this vaccine at local pharmacy or Health Dept.or vaccine clinic. Aware to provide a copy of the vaccination record if obtained from local pharmacy or Health Dept. Verbalized acceptance and understanding.  Qualifies for Shingles Vaccine? Yes   Zostavax completed No   Shingrix Completed?: No.    Education has been provided regarding the importance of this vaccine. Patient has been advised to call insurance company to determine out of pocket expense if they have not yet received this vaccine. Advised may also receive vaccine at local pharmacy or Health Dept. Verbalized acceptance and understanding.  Screening Tests Health Maintenance  Topic Date Due   HIV Screening  Never done   DTaP/Tdap/Td (1 - Tdap) Never done   COVID-19 Vaccine (1) 07/13/2022 (Originally 02/28/1971)   INFLUENZA VACCINE  08/12/2022 (Originally 12/12/2021)   Zoster Vaccines- Shingrix (1 of 2) 09/25/2022 (Originally [birth year]1985)   Medicare Annual Wellness (AWV)  06/28/2023   COLONOSCOPY (Pts 45-68yr Insurance coverage will need to be confirmed)  01/27/2030   Hepatitis C Screening  Completed   HPV VACCINES  Aged Out    Health Maintenance  Health Maintenance Due  Topic Date Due   HIV Screening  Never done   DTaP/Tdap/Td (1 - Tdap) Never  done    Colorectal cancer screening: Type of screening: Colonoscopy. Completed 01/28/2020. Repeat every 10 years  Lung Cancer Screening: (Low Dose CT Chest recommended if Age 90-80 years, 30 pack-year currently smoking OR have quit w/in 15years.) does not qualify.   Lung Cancer Screening Referral: no  Additional Screening:  Hepatitis C Screening: does not qualify; Completed 12/09/2019  Vision Screening: Recommended annual ophthalmology exams for early detection of glaucoma and other disorders of the eye. Is the patient up to date with their annual eye exam?  No  Who is the provider or what is the name of the office in which the patient attends annual eye exams? none If pt is not established with a provider, would they like to be referred to a provider to establish care? No .   Dental Screening: Recommended annual dental exams for proper oral hygiene  Community Resource Referral / Chronic Care Management: CRR required this visit?  No   CCM required this visit?  No      Plan:     I have personally reviewed and noted the following in the patient's chart:   Medical and social history Use of alcohol, tobacco or illicit drugs  Current medications and supplements including opioid prescriptions. Patient is not currently taking opioid prescriptions. Functional ability and status Nutritional status Physical activity Advanced directives List of other physicians Hospitalizations, surgeries, and ER visits in previous 12 months Vitals Screenings to include cognitive, depression, and falls Referrals and appointments  In addition, I have reviewed and discussed with patient certain preventive protocols, quality metrics, and best practice recommendations. A written personalized care plan for preventive services as well as general preventive health recommendations were provided to patient.     Kellie Simmering, LPN   [id number]   Nurse Notes: none

## 2022-08-20 ENCOUNTER — Encounter: Payer: Self-pay | Admitting: Nurse Practitioner

## 2022-08-20 ENCOUNTER — Ambulatory Visit (INDEPENDENT_AMBULATORY_CARE_PROVIDER_SITE_OTHER): Payer: Medicare HMO | Admitting: Nurse Practitioner

## 2022-08-20 VITALS — BP 138/88 | HR 100 | Temp 97.3°F | Ht 67.5 in | Wt 199.0 lb

## 2022-08-20 DIAGNOSIS — E7800 Pure hypercholesterolemia, unspecified: Secondary | ICD-10-CM

## 2022-08-20 DIAGNOSIS — R634 Abnormal weight loss: Secondary | ICD-10-CM | POA: Insufficient documentation

## 2022-08-20 DIAGNOSIS — Z6830 Body mass index (BMI) 30.0-30.9, adult: Secondary | ICD-10-CM | POA: Diagnosis not present

## 2022-08-20 DIAGNOSIS — E6609 Other obesity due to excess calories: Secondary | ICD-10-CM

## 2022-08-20 DIAGNOSIS — Q899 Congenital malformation, unspecified: Secondary | ICD-10-CM | POA: Insufficient documentation

## 2022-08-20 DIAGNOSIS — I10 Essential (primary) hypertension: Secondary | ICD-10-CM | POA: Diagnosis not present

## 2022-08-20 DIAGNOSIS — C61 Malignant neoplasm of prostate: Secondary | ICD-10-CM

## 2022-08-20 MED ORDER — OLMESARTAN MEDOXOMIL 20 MG PO TABS: ORAL_TABLET | ORAL | 1 refills | Status: DC

## 2022-08-20 NOTE — Progress Notes (Signed)
I,Sheena H Holbrook,acting as a Neurosurgeonscribe for Arnette FeltsJanece Quamaine Webb, FNP.,have documented all relevant documentation on the behalf of Arnette FeltsJanece Eryn Marandola, FNP,as directed by  Arnette FeltsJanece Cecillia Menees, FNP while in the presence of Arnette FeltsJanece Azaiah Mello, FNP.    Subjective:     Patient ID: Christopher Watson , male    DOB: 13-Apr-1966 , 57 y.o.   MRN: [id number]   Chief Complaint  Patient presents with   Medical Management of Chronic Issues    HPI  Patient presents with brother today for hypertension follow up.   Brother notes possible issues with loose bowel movements, some seem uncontrolled. He does not note any recent changes to his diet or supplements. Patient does not complain of any abdominal pain, nausea/emesis. Brother denies noticing any blood in stool. Have incorporated wet wipes. Does not see any redness. Continues to eat well, his brother feels like he is eating better.   He has gained his weight back after having treatment for prostate cancer. Planning to get back active.   Wt Readings from Last 3 Encounters: 08/20/22 : 199 lb (90.3 kg) 06/27/22 : 189 lb 6.4 oz (85.9 kg) 01/22/22 : 157 lb 4.8 oz (71.4 kg)        Past Medical History:  Diagnosis Date   Hypertension    Mental disability    stuttering, learning disability father is legal guardian   Prostate cancer      Family History  Problem Relation Age of Onset   Hypertension Father    Prostate cancer Father    Prostate cancer Brother      Current Outpatient Medications:    olmesartan (BENICAR) 20 MG tablet, TAKE 1 TABLET(20 MG) BY MOUTH DAILY, Disp: 90 tablet, Rfl: 1   Allergies  Allergen Reactions   Bee Venom Swelling     Review of Systems  Gastrointestinal:  Positive for diarrhea.  All other systems reviewed and are negative.    Today's Vitals   08/20/22 0946  BP: 138/88  Pulse: 100  Temp: (!) 97.3 F (36.3 C)  TempSrc: Oral  SpO2: 98%  Weight: 199 lb (90.3 kg)  Height: 5' 7.5" (1.715 m)   Body mass index is 30.71 kg/m.    Objective:  Physical Exam Vitals reviewed.  Constitutional:      General: He is not in acute distress.    Appearance: Normal appearance.  Cardiovascular:     Rate and Rhythm: Normal rate and regular rhythm.     Pulses: Normal pulses.     Heart sounds: Normal heart sounds. No murmur heard. Pulmonary:     Effort: Pulmonary effort is normal. No respiratory distress.     Breath sounds: Normal breath sounds. No wheezing.  Skin:    General: Skin is warm and dry.     Capillary Refill: Capillary refill takes less than 2 seconds.  Neurological:     General: No focal deficit present.     Mental Status: He is alert.     Cranial Nerves: No cranial nerve deficit.     Motor: No weakness.     Comments: Patient has a developmental delay, brother is present to provide information.   Psychiatric:        Mood and Affect: Mood normal.        Behavior: Behavior normal.        Thought Content: Thought content normal.        Judgment: Judgment normal.         Assessment And Plan:     1.  Essential hypertension Comments: Blood pressure is fairly controlled, continue current medications. Will check kidney fuctions - Amb Referral To Provider Referral Exercise Program (P.R.E.P) - olmesartan (BENICAR) 20 MG tablet; TAKE 1 TABLET(20 MG) BY MOUTH DAILY  Dispense: 90 tablet; Refill: 1 - BMP8+eGFR  2. Elevated LDL cholesterol level Comments: cholesterol levels are slightly elevated, will check lipid panel. - Lipid panel  3. Malignant neoplasm of prostate Comments: Continue f/u with Urology, his brother is planning to call to get scheduled for an appt  4. Class 1 obesity due to excess calories with serious comorbidity and body mass index (BMI) of 30.0 to 30.9 in adult He is encouraged to strive for BMI less than 30 to decrease cardiac risk. Advised to aim for at least 150 minutes of exercise per week. Will refer to PREP.  - Amb Referral To Provider Referral Exercise Program (P.R.E.P)    Patient  was given opportunity to ask questions. Patient verbalized understanding of the plan and was able to repeat key elements of the plan. All questions were answered to their satisfaction.  Arnette Felts, FNP   I, Arnette Felts, FNP, have reviewed all documentation for this visit. The documentation on 08/20/22 for the exam, diagnosis, procedures, and orders are all accurate and complete.   IF YOU HAVE BEEN REFERRED TO A SPECIALIST, IT MAY TAKE 1-2 WEEKS TO SCHEDULE/PROCESS THE REFERRAL. IF YOU HAVE NOT HEARD FROM US/SPECIALIST IN TWO WEEKS, PLEASE GIVE [place] A CALL AT [phone].   THE PATIENT IS ENCOURAGED TO PRACTICE SOCIAL DISTANCING DUE TO THE COVID-19 PANDEMIC.

## 2022-08-21 LAB — LIPID PANEL
Chol/HDL Ratio: 3.8 ratio (ref 0.0–5.0)
Cholesterol, Total: 186 mg/dL (ref 100–199)
HDL: 49 mg/dL (ref >39)
LDL Chol Calc (NIH): 115 mg/dL — ABNORMAL HIGH (ref 0–99)
Triglycerides: 121 mg/dL (ref 0–149)
VLDL Cholesterol Cal: 22 mg/dL (ref 5–40)

## 2022-08-21 LAB — BMP8+EGFR
BUN/Creatinine Ratio: 19 (ref 9–20)
BUN: 17 mg/dL (ref 6–24)
CO2: 23 mmol/L (ref 20–29)
Calcium: 9.7 mg/dL (ref 8.7–10.2)
Chloride: 103 mmol/L (ref 96–106)
Creatinine, Ser: 0.91 mg/dL (ref 0.76–1.27)
Glucose: 99 mg/dL (ref 70–99)
Potassium: 4.6 mmol/L (ref 3.5–5.2)
Sodium: 143 mmol/L (ref 134–144)
eGFR: 99 mL/min/{1.73_m2} (ref >59)

## 2022-08-22 ENCOUNTER — Telehealth: Payer: Self-pay

## 2022-08-22 NOTE — Telephone Encounter (Signed)
Called pt's brother Chrissie Noa re: PREP program referral, left voicemail

## 2022-08-23 ENCOUNTER — Telehealth: Payer: Self-pay

## 2022-08-23 NOTE — Telephone Encounter (Signed)
Returned his call re: PREP program, left voicemail

## 2022-08-23 NOTE — Telephone Encounter (Signed)
Able to touch base with pt's brother Christopher Watson, explained PREP, Christopher Watson most likely Christopher Watson not retain much from the education part, but really needs the cardio/strength training. Enc'd Christopher Watson to either attend PREP with his brother (Christopher Watson need referral from his provider) or attend as an Geophysicist/field seismologist to [place] during the class. He would like to attend the June 3 class at [facility], every M/W 12:30-1:45; Christopher Watson contact Mid-May to confirm and set up assessment visit.

## 2022-10-03 NOTE — Progress Notes (Signed)
Spoke w/pt's brother/guardian at [employer]; pt will be out of town some this summer so will defer attending PREP for now; have asked Wil to let me know when they are ready to attend later this summer or fall.

## 2022-11-21 IMAGING — PT NM PET TUM IMG SKULL BASE T - THIGH
1 series · 1 of 1 positions shown · non-contrast
Comparison: None.

CLINICAL DATA: Prostate carcinoma with biochemical recurrence. PSA
equal

EXAM:
NUCLEAR MEDICINE PET SKULL BASE TO THIGH
TECHNIQUE: 8.3 mCi F18 Piflufolastat (Pylarify) was injected intravenously.
Full-ring PET imaging was performed from the skull base to thigh
after the radiotracer. CT data was obtained and used for attenuation
correction and anatomic localization.

[Series 1089: results mm oncology reading · 1.0mm · 0.50mm/px · 1 of 1 slices shown]
[im 1/1]
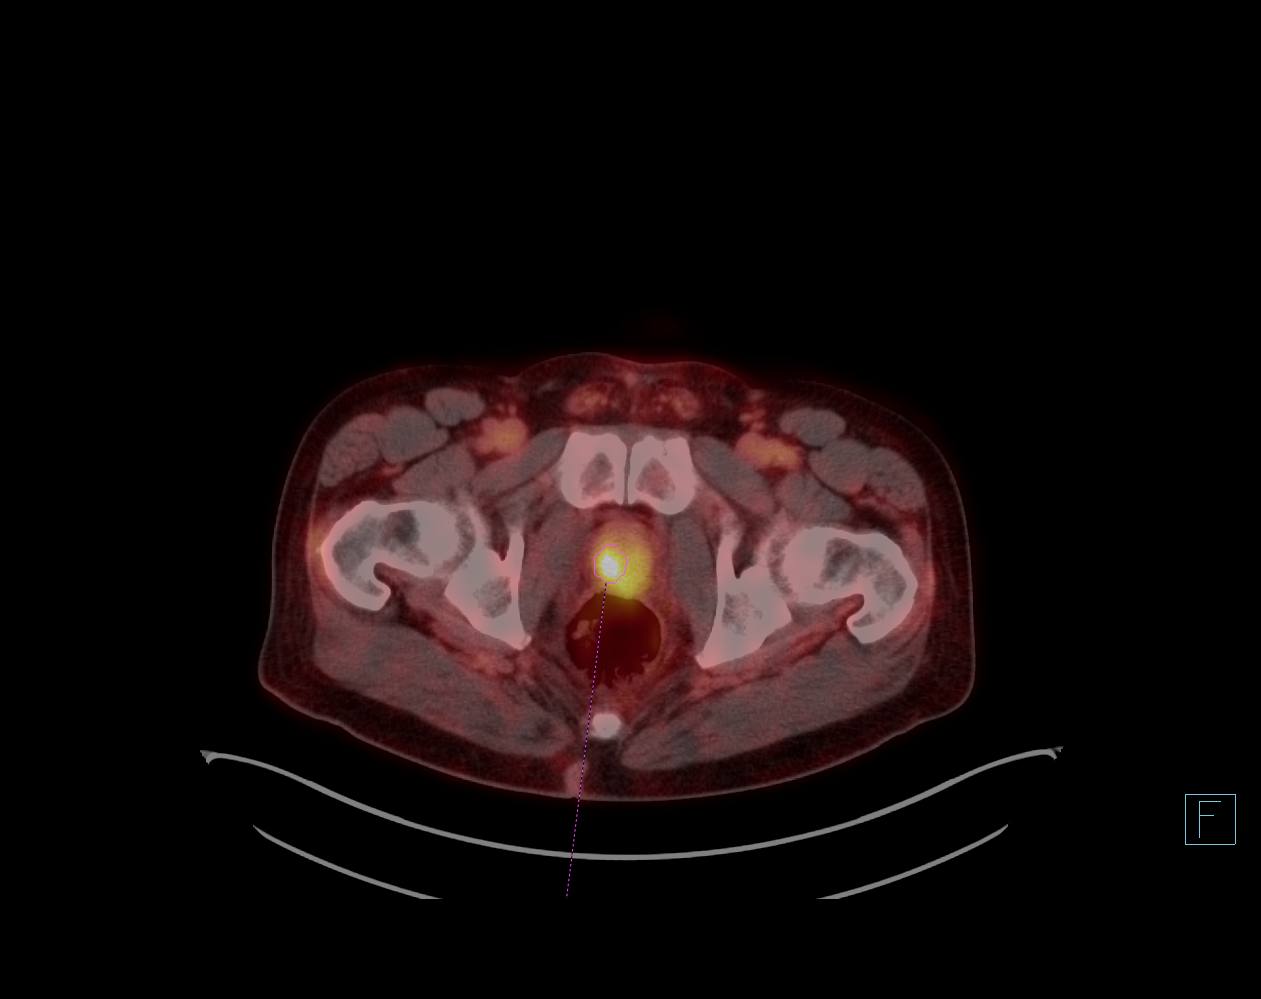

[1 of 1 positions shown; findings below may reference images not displayed]

FINDINGS: NECK

No radiotracer activity in neck lymph nodes.

Incidental CT finding: None

CHEST

No radiotracer accumulation within mediastinal or hilar lymph nodes.
No suspicious pulmonary nodules on the CT scan.

Incidental CT finding: Linear nodule along the RIGHT oblique fissure
measuring 9 mm (97/4) benign morphology. No radiotracer activity

ABDOMEN/PELVIS

Prostate: Focal activity in the RIGHT lobe of the prostate gland
with SUV max equal 6.4. Moderate size lesion measuring approximately
1.5 cm

Lymph nodes: No abnormal radiotracer accumulation within pelvic or
abdominal nodes.

Liver: No evidence of liver metastasis

Incidental CT finding: None

SKELETON

No focal activity to suggest skeletal metastasis. No blastic lesion
IMPRESSION: 1. Focal activity RIGHT lobe of the prostate gland consistent with
prostate carcinoma.
2. No evidence metastatic adenopathy in the pelvis or periaortic
retroperitoneum.
3. No evidence of visceral metastasis or skeletal metastasis.
4. RIGHT lung nodule appears benign.

## 2023-01-08 NOTE — Progress Notes (Unsigned)
Christopher Watson, CMA,acting as a Neurosurgeon for Arnette Felts, FNP.,have documented all relevant documentation on the behalf of Arnette Felts, FNP,as directed by  Arnette Felts, FNP while in the presence of Arnette Felts, FNP.  Subjective:   Patient ID: Christopher Watson , male    DOB: 1965-05-15 , 56 y.o.   MRN: [id number]  No chief complaint on file.   HPI  Patient presents today for HM, patient reports compliance with medications. Patient denies any chest pain, SOB, or headaches. Patient has no concerns today.    Past Medical History:  Diagnosis Date  . Hypertension   . Mental disability    stuttering, learning disability father is legal guardian  . Prostate cancer [facility])      Family History  Problem Relation Age of Onset  . Hypertension Father   . Prostate cancer Father   . Prostate cancer Brother      Current Outpatient Medications:  .  olmesartan (BENICAR) 20 MG tablet, TAKE 1 TABLET(20 MG) BY MOUTH DAILY, Disp: 90 tablet, Rfl: 1   Allergies  Allergen Reactions  . Bee Venom Swelling     Men's preventive visit. Patient Health Questionnaire (PHQ-2) is  Flowsheet Row Clinical Support from 06/27/2022 in [facility]  PHQ-2 Total Score 0     . Patient is on a *** diet. Marital status: Single. Relevant history for alcohol use is:  Social History   Substance and Sexual Activity  Alcohol Use No  . Relevant history for tobacco use is:  Social History   Tobacco Use  Smoking Status Never  Smokeless Tobacco Never  .   Review of Systems   There were no vitals filed for this visit. There is no height or weight on file to calculate BMI.  Wt Readings from Last 3 Encounters:  08/20/22 199 lb (90.3 kg)  06/27/22 189 lb 6.4 oz (85.9 kg)  01/22/22 157 lb 4.8 oz (71.4 kg)    Objective:  Physical Exam      Assessment And Plan:    Encounter for annual health examination  Essential hypertension  Elevated LDL cholesterol level  Malignant  neoplasm of prostate (HCC)     No follow-ups on file. Patient was given opportunity to ask questions. Patient verbalized understanding of the plan and was able to repeat key elements of the plan. All questions were answered to their satisfaction.   Arnette Felts, FNP  I, Arnette Felts, FNP, have reviewed all documentation for this visit. The documentation on 01/08/23 for the exam, diagnosis, procedures, and orders are all accurate and complete.

## 2023-01-09 ENCOUNTER — Encounter: Payer: Medicare HMO | Admitting: Nurse Practitioner

## 2023-01-09 DIAGNOSIS — Z0000 Encounter for general adult medical examination without abnormal findings: Secondary | ICD-10-CM

## 2023-01-09 DIAGNOSIS — I10 Essential (primary) hypertension: Secondary | ICD-10-CM

## 2023-01-09 DIAGNOSIS — C61 Malignant neoplasm of prostate: Secondary | ICD-10-CM

## 2023-01-09 DIAGNOSIS — E7800 Pure hypercholesterolemia, unspecified: Secondary | ICD-10-CM

## 2023-01-17 ENCOUNTER — Other Ambulatory Visit: Payer: Self-pay

## 2023-01-17 ENCOUNTER — Ambulatory Visit (INDEPENDENT_AMBULATORY_CARE_PROVIDER_SITE_OTHER): Payer: Medicare HMO | Admitting: Family Medicine

## 2023-01-17 ENCOUNTER — Encounter: Payer: Self-pay | Admitting: Family Medicine

## 2023-01-17 VITALS — BP 150/90 | HR 87 | Temp 98.6°F | Ht 67.8 in | Wt 210.8 lb

## 2023-01-17 DIAGNOSIS — F79 Unspecified intellectual disabilities: Secondary | ICD-10-CM | POA: Diagnosis not present

## 2023-01-17 DIAGNOSIS — Z79899 Other long term (current) drug therapy: Secondary | ICD-10-CM

## 2023-01-17 DIAGNOSIS — E6609 Other obesity due to excess calories: Secondary | ICD-10-CM | POA: Diagnosis not present

## 2023-01-17 DIAGNOSIS — E7800 Pure hypercholesterolemia, unspecified: Secondary | ICD-10-CM | POA: Diagnosis not present

## 2023-01-17 DIAGNOSIS — Z6832 Body mass index (BMI) 32.0-32.9, adult: Secondary | ICD-10-CM

## 2023-01-17 DIAGNOSIS — R159 Full incontinence of feces: Secondary | ICD-10-CM

## 2023-01-17 DIAGNOSIS — Z2821 Immunization not carried out because of patient refusal: Secondary | ICD-10-CM

## 2023-01-17 DIAGNOSIS — Z0000 Encounter for general adult medical examination without abnormal findings: Secondary | ICD-10-CM

## 2023-01-17 DIAGNOSIS — I10 Essential (primary) hypertension: Secondary | ICD-10-CM | POA: Diagnosis not present

## 2023-01-17 DIAGNOSIS — E66811 Obesity, class 1: Secondary | ICD-10-CM

## 2023-01-17 MED ORDER — OLMESARTAN MEDOXOMIL 20 MG PO TABS
20.0000 mg | ORAL_TABLET | Freq: Every day | ORAL | 1 refills | Status: DC
Start: 2023-01-17 — End: 2023-07-17

## 2023-01-17 MED ORDER — OLMESARTAN MEDOXOMIL 20 MG PO TABS
ORAL_TABLET | ORAL | 1 refills | Status: DC
Start: 2023-01-17 — End: 2023-01-17

## 2023-01-17 NOTE — Progress Notes (Signed)
I,Christopher Watson, CMA,acting as a Neurosurgeon for [employer], NP.,have documented all relevant documentation on the behalf of Christopher Hose, NP,as directed by  Christopher Hose, NP while in the presence of Christopher Hose, NP.  Subjective:   Patient ID: Christopher Watson , male    DOB: 05-02-1966 , 57 y.o.   MRN: [id number]  Chief Complaint  Patient presents with   Annual Exam    HPI  Patient is a 57 year old African American Male who is  here today for Health Maintenance. He is accompanied with his Brother Christopher Watson. His brother Christopher Watson states that it seems that the patient has started having Bowel leakage since after he stopped radiation for prostate cancer.Patient 's last radiation session was summer last year.Patient has some mental disability, he lives at home with his brother and father( reports his Brother)  Hypertension This is a chronic problem. The current episode started more than 1 year ago. The problem is controlled. Pertinent negatives include no anxiety. There are no associated agents to hypertension. Risk factors for coronary artery disease include sedentary lifestyle and male gender. Past treatments include angiotensin blockers. There are no compliance problems.  There is no history of angina or kidney disease. There is no history of chronic renal disease.     Past Medical History:  Diagnosis Date   Hypertension    Mental disability    stuttering, learning disability father is legal guardian   Prostate cancer (HCC)      Family History  Problem Relation Age of Onset   Hypertension Father    Prostate cancer Father    Prostate cancer Brother      Current Outpatient Medications:    olmesartan (BENICAR) 20 MG tablet, Take 1 tablet (20 mg total) by mouth daily., Disp: 90 tablet, Rfl: 1   Allergies  Allergen Reactions   Bee Venom Swelling     Men's preventive visit. Patient Health Questionnaire (PHQ-2) is  Flowsheet Row Office Visit from 01/17/2023 in [facility]  PHQ-2 Total Score 0      Social History   Substance and Sexual Activity  Alcohol Use No   Social History   Tobacco Use  Smoking Status Never  Smokeless Tobacco Never  .   Review of Systems  Constitutional: Negative.   HENT: Negative.    Eyes: Negative.   Respiratory: Negative.    Cardiovascular: Negative.   Gastrointestinal:        Incontinence  Endocrine: Negative.   Genitourinary: Negative.   Musculoskeletal: Negative.   Skin: Negative.   Allergic/Immunologic: Negative.   Neurological: Negative.   Hematological: Negative.   Psychiatric/Behavioral:  Positive for behavioral problems.      Today's Vitals   01/17/23 0919  BP: (!) 150/90  Pulse: 87  Temp: 98.6 F (37 C)  Weight: 210 lb 12.8 oz (95.6 kg)  Height: 5' 7.8" (1.722 m)  PainSc: 0-No pain   Body mass index is 32.24 kg/m.  Wt Readings from Last 3 Encounters:  01/17/23 210 lb 12.8 oz (95.6 kg)  08/20/22 199 lb (90.3 kg)  06/27/22 189 lb 6.4 oz (85.9 kg)    Objective:  Physical Exam HENT:     Head: Normocephalic.     Nose: Nose normal.     Mouth/Throat:     Mouth: Mucous membranes are moist.  Cardiovascular:     Rate and Rhythm: Normal rate and regular rhythm.  Abdominal:     General: Bowel sounds are normal.  Genitourinary:    Comments: Did not examine Musculoskeletal:        General: Normal range of motion.  Skin:    General: Skin is warm and dry.  Neurological:     Mental Status: He is alert. Mental status is at baseline.  Psychiatric:        Mood and Affect: Mood normal.        Behavior: Behavior normal.         Assessment And Plan:    Encounter for general adult medical examination w/o abnormal findings  Essential hypertension -     CMP14+EGFR -     CBC -     Microalbumin / creatinine urine ratio -     EKG 12-Lead -     Olmesartan Medoxomil; Take 1 tablet (20 mg total) by mouth daily.  Dispense: 90 tablet; Refill: 1  Elevated LDL cholesterol  level Assessment & Plan: Low fat diet advised  Orders: -     Lipid panel  Class 1 obesity due to excess calories with body mass index (BMI) of 32.0 to 32.9 in adult, unspecified whether serious comorbidity present  Influenza vaccination declined  Mental disability Assessment & Plan: Born with congenital malformation   Incontinence of feces, unspecified fecal incontinence type Assessment & Plan: Per brother, this started since after radiation for Prostate CA.      Return in 6 months (on 07/17/2023) for blood pressure. Patient was given opportunity to ask questions. Patient verbalized understanding of the plan and was able to repeat key elements of the plan. All questions were answered to their satisfaction.   Christopher Hose, NP I, Christopher Hose, NP, have reviewed all documentation for this visit. The documentation on 01/30/23 for the exam, diagnosis, procedures, and orders are all accurate and complete.

## 2023-01-18 LAB — CMP14+EGFR
ALT: 15 IU/L (ref 0–44)
AST: 12 IU/L (ref 0–40)
Albumin: 4.4 g/dL (ref 3.8–4.9)
Alkaline Phosphatase: 124 IU/L — ABNORMAL HIGH (ref 44–121)
BUN/Creatinine Ratio: 17 (ref 9–20)
BUN: 16 mg/dL (ref 6–24)
Bilirubin Total: 0.3 mg/dL (ref 0.0–1.2)
CO2: 24 mmol/L (ref 20–29)
Calcium: 9.1 mg/dL (ref 8.7–10.2)
Chloride: 103 mmol/L (ref 96–106)
Creatinine, Ser: 0.94 mg/dL (ref 0.76–1.27)
Globulin, Total: 2.7 g/dL (ref 1.5–4.5)
Glucose: 101 mg/dL — ABNORMAL HIGH (ref 70–99)
Potassium: 4.8 mmol/L (ref 3.5–5.2)
Sodium: 140 mmol/L (ref 134–144)
Total Protein: 7.1 g/dL (ref 6.0–8.5)
eGFR: 95 mL/min/{1.73_m2} (ref >59)

## 2023-01-18 LAB — MICROALBUMIN / CREATININE URINE RATIO
Creatinine, Urine: 254.3 mg/dL
Microalb/Creat Ratio: 21 mg/g{creat} (ref 0–29)
Microalbumin, Urine: 53.7 ug/mL

## 2023-01-18 LAB — LIPID PANEL
Chol/HDL Ratio: 4.1 ratio (ref 0.0–5.0)
Cholesterol, Total: 178 mg/dL (ref 100–199)
HDL: 43 mg/dL (ref >39)
LDL Chol Calc (NIH): 124 mg/dL — ABNORMAL HIGH (ref 0–99)
Triglycerides: 56 mg/dL (ref 0–149)
VLDL Cholesterol Cal: 11 mg/dL (ref 5–40)

## 2023-01-18 LAB — CBC
Hematocrit: 44.7 % (ref 37.5–51.0)
Hemoglobin: 14.1 g/dL (ref 13.0–17.7)
MCH: 27.4 pg (ref 26.6–33.0)
MCHC: 31.5 g/dL (ref 31.5–35.7)
MCV: 87 fL (ref 79–97)
Platelets: 248 10*3/uL (ref 150–450)
RBC: 5.14 x10E6/uL (ref 4.14–5.80)
RDW: 13.3 % (ref 11.6–15.4)
WBC: 5.5 10*3/uL (ref 3.4–10.8)

## 2023-01-30 DIAGNOSIS — Z79899 Other long term (current) drug therapy: Secondary | ICD-10-CM | POA: Insufficient documentation

## 2023-01-30 DIAGNOSIS — E7800 Pure hypercholesterolemia, unspecified: Secondary | ICD-10-CM | POA: Insufficient documentation

## 2023-01-30 DIAGNOSIS — R159 Full incontinence of feces: Secondary | ICD-10-CM | POA: Insufficient documentation

## 2023-01-30 DIAGNOSIS — Z0000 Encounter for general adult medical examination without abnormal findings: Secondary | ICD-10-CM | POA: Insufficient documentation

## 2023-01-30 DIAGNOSIS — F79 Unspecified intellectual disabilities: Secondary | ICD-10-CM | POA: Insufficient documentation

## 2023-01-30 DIAGNOSIS — Z6832 Body mass index (BMI) 32.0-32.9, adult: Secondary | ICD-10-CM | POA: Insufficient documentation

## 2023-01-30 NOTE — Assessment & Plan Note (Signed)
Born with congenital malformation

## 2023-01-30 NOTE — Assessment & Plan Note (Signed)
Low fat diet advised

## 2023-01-30 NOTE — Assessment & Plan Note (Signed)
Per brother, this started since after radiation for Prostate CA.

## 2023-02-07 ENCOUNTER — Telehealth: Payer: Self-pay | Admitting: Pharmacist

## 2023-02-07 NOTE — Progress Notes (Signed)
02/07/2023  Patient ID: Christopher Watson, male   DOB: 08-15-1965, 57 y.o.   MRN: [id number]  Tried calling patient's brother for [employer] [facility] due to elevated BP. Unable to leave voicemail due to box not being set-up.    Marlowe Aschoff, PharmD [facility] Phone Number: [phone]

## 2023-07-16 NOTE — Progress Notes (Unsigned)
 Madelaine Bhat, CMA,acting as a Neurosurgeon for Arnette Felts, FNP.,have documented all relevant documentation on the behalf of Arnette Felts, FNP,as directed by  Arnette Felts, FNP while in the presence of Arnette Felts, FNP.  Subjective:  Patient ID: Christopher Watson , male    DOB: Oct 09, 1965 , 58 y.o.   MRN: [id number]  No chief complaint on file.   HPI  Patient presents today for a bp follow up, Patient reports compliance with medication. Patient denies any chest pain, SOB, or headaches. Patient has no concerns today.     Past Medical History:  Diagnosis Date  . Hypertension   . Mental disability    stuttering, learning disability father is legal guardian  . Prostate cancer [facility])      Family History  Problem Relation Age of Onset  . Hypertension Father   . Prostate cancer Father   . Prostate cancer Brother      Current Outpatient Medications:  .  olmesartan (BENICAR) 20 MG tablet, Take 1 tablet (20 mg total) by mouth daily., Disp: 90 tablet, Rfl: 1   Allergies  Allergen Reactions  . Bee Venom Swelling     Review of Systems   There were no vitals filed for this visit. There is no height or weight on file to calculate BMI.  Wt Readings from Last 3 Encounters:  01/17/23 210 lb 12.8 oz (95.6 kg)  08/20/22 199 lb (90.3 kg)  06/27/22 189 lb 6.4 oz (85.9 kg)    The 10-year ASCVD risk score (Arnett DK, et al., 2019) is: 16%   Values used to calculate the score:     Age: 80 years     Sex: Male     Is Non-Hispanic African American: Yes     Diabetic: No     Tobacco smoker: No     Systolic Blood Pressure: 150 mmHg     Is BP treated: Yes     HDL Cholesterol: 43 mg/dL     Total Cholesterol: 178 mg/dL  Objective:  Physical Exam      Assessment And Plan:  Essential hypertension    No follow-ups on file.  Patient was given opportunity to ask questions. Patient verbalized understanding of the plan and was able to repeat key elements of the plan. All questions were answered  to their satisfaction.    Jeanell Sparrow, FNP, have reviewed all documentation for this visit. The documentation on 07/16/23 for the exam, diagnosis, procedures, and orders are all accurate and complete.   IF YOU HAVE BEEN REFERRED TO A SPECIALIST, IT MAY TAKE 1-2 WEEKS TO SCHEDULE/PROCESS THE REFERRAL. IF YOU HAVE NOT HEARD FROM US/SPECIALIST IN TWO WEEKS, PLEASE GIVE [place] A CALL AT [phone].

## 2023-07-17 ENCOUNTER — Encounter: Payer: Self-pay | Admitting: Nurse Practitioner

## 2023-07-17 ENCOUNTER — Ambulatory Visit: Payer: Medicare HMO | Admitting: Nurse Practitioner

## 2023-07-17 VITALS — BP 138/76 | HR 85 | Temp 98.7°F | Ht 67.0 in | Wt 213.6 lb

## 2023-07-17 DIAGNOSIS — Z6833 Body mass index (BMI) 33.0-33.9, adult: Secondary | ICD-10-CM | POA: Diagnosis not present

## 2023-07-17 DIAGNOSIS — E6609 Other obesity due to excess calories: Secondary | ICD-10-CM | POA: Diagnosis not present

## 2023-07-17 DIAGNOSIS — E66811 Obesity, class 1: Secondary | ICD-10-CM | POA: Diagnosis not present

## 2023-07-17 DIAGNOSIS — Z23 Encounter for immunization: Secondary | ICD-10-CM

## 2023-07-17 DIAGNOSIS — E7800 Pure hypercholesterolemia, unspecified: Secondary | ICD-10-CM

## 2023-07-17 DIAGNOSIS — C61 Malignant neoplasm of prostate: Secondary | ICD-10-CM

## 2023-07-17 DIAGNOSIS — R151 Fecal smearing: Secondary | ICD-10-CM | POA: Diagnosis not present

## 2023-07-17 DIAGNOSIS — Z2821 Immunization not carried out because of patient refusal: Secondary | ICD-10-CM

## 2023-07-17 DIAGNOSIS — I10 Essential (primary) hypertension: Secondary | ICD-10-CM

## 2023-07-17 MED ORDER — OLMESARTAN MEDOXOMIL 20 MG PO TABS
20.0000 mg | ORAL_TABLET | Freq: Every day | ORAL | 1 refills | Status: DC
Start: 2023-07-17 — End: 2024-01-30

## 2023-07-17 NOTE — Assessment & Plan Note (Signed)
 Declines shingrix, educated on disease process and is aware if he changes his mind to notify office

## 2023-07-17 NOTE — Assessment & Plan Note (Signed)
 His brother is going to make me aware if he is eligible for Medicaid. Also advised to check with his insurance plan they may have briefs available in their catalog.

## 2023-07-17 NOTE — Assessment & Plan Note (Signed)

## 2023-07-17 NOTE — Assessment & Plan Note (Signed)
 tetanus vaccine given today while in office. Refer to order management. TDAP will be administered to adults 65-58 years old every 10 years.

## 2023-07-17 NOTE — Assessment & Plan Note (Signed)
 Cholesterol levels slightly increased at last visit, will check lipid panel

## 2023-07-17 NOTE — Assessment & Plan Note (Signed)
 He is encouraged to strive for BMI less than 30 to decrease cardiac risk. Advised to aim for at least 150 minutes of exercise per week.

## 2023-07-17 NOTE — Assessment & Plan Note (Signed)
 I advised his brother to contact urology for f/u,

## 2023-07-17 NOTE — Assessment & Plan Note (Signed)
 Blood pressure improved with repeat, continue current medications. Refill sent to pharmacy

## 2023-07-17 NOTE — Patient Instructions (Signed)
[facility] [phone] 

## 2023-07-18 LAB — LIPID PANEL
Chol/HDL Ratio: 4.7 ratio (ref 0.0–5.0)
Cholesterol, Total: 188 mg/dL (ref 100–199)
HDL: 40 mg/dL (ref >39)
LDL Chol Calc (NIH): 127 mg/dL — ABNORMAL HIGH (ref 0–99)
Triglycerides: 113 mg/dL (ref 0–149)
VLDL Cholesterol Cal: 21 mg/dL (ref 5–40)

## 2023-07-18 LAB — BMP8+EGFR
BUN/Creatinine Ratio: 15 (ref 9–20)
BUN: 13 mg/dL (ref 6–24)
CO2: 22 mmol/L (ref 20–29)
Calcium: 9.3 mg/dL (ref 8.7–10.2)
Chloride: 102 mmol/L (ref 96–106)
Creatinine, Ser: 0.87 mg/dL (ref 0.76–1.27)
Glucose: 90 mg/dL (ref 70–99)
Potassium: 4.2 mmol/L (ref 3.5–5.2)
Sodium: 140 mmol/L (ref 134–144)
eGFR: 101 mL/min/{1.73_m2} (ref >59)

## 2023-07-18 LAB — MICROALBUMIN / CREATININE URINE RATIO
Creatinine, Urine: 308 mg/dL
Microalb/Creat Ratio: 4 mg/g{creat} (ref 0–29)
Microalbumin, Urine: 13.1 ug/mL

## 2023-08-21 ENCOUNTER — Other Ambulatory Visit: Payer: Self-pay | Admitting: Nurse Practitioner

## 2023-08-21 DIAGNOSIS — E7800 Pure hypercholesterolemia, unspecified: Secondary | ICD-10-CM

## 2023-08-21 NOTE — Progress Notes (Signed)
 Ordered calcium risk score

## 2023-08-30 ENCOUNTER — Other Ambulatory Visit (HOSPITAL_COMMUNITY)

## 2023-08-30 ENCOUNTER — Encounter (HOSPITAL_COMMUNITY): Payer: Self-pay

## 2023-10-14 ENCOUNTER — Ambulatory Visit (HOSPITAL_COMMUNITY)
Admission: RE | Admit: 2023-10-14 | Discharge: 2023-10-14 | Disposition: A | Discharge status: Home / Self Care | Source: Ambulatory Visit | Attending: Nurse Practitioner | Admitting: Nurse Practitioner

## 2023-10-14 DIAGNOSIS — E7800 Pure hypercholesterolemia, unspecified: Secondary | ICD-10-CM | POA: Insufficient documentation

## 2023-10-21 ENCOUNTER — Other Ambulatory Visit (HOSPITAL_COMMUNITY): Payer: Self-pay | Admitting: Nurse Practitioner

## 2023-10-21 DIAGNOSIS — Z8249 Family history of ischemic heart disease and other diseases of the circulatory system: Secondary | ICD-10-CM

## 2023-11-06 NOTE — Addendum Note (Signed)
 Encounter addended by: Harvest Olam LABOR on: 11/06/2023 2:27 PM  Actions taken: Imaging Exam ended

## 2023-11-08 ENCOUNTER — Ambulatory Visit (HOSPITAL_COMMUNITY)

## 2023-11-22 ENCOUNTER — Ambulatory Visit (HOSPITAL_COMMUNITY)
Admission: RE | Admit: 2023-11-22 | Source: Ambulatory Visit | Attending: Nurse Practitioner | Admitting: Nurse Practitioner

## 2023-11-29 ENCOUNTER — Ambulatory Visit (HOSPITAL_COMMUNITY)
Admission: RE | Admit: 2023-11-29 | Discharge: 2023-11-29 | Disposition: A | Discharge status: Home / Self Care | Payer: Self-pay | Source: Ambulatory Visit | Attending: Nurse Practitioner | Admitting: Nurse Practitioner

## 2023-11-29 DIAGNOSIS — R918 Other nonspecific abnormal finding of lung field: Secondary | ICD-10-CM | POA: Insufficient documentation

## 2023-11-29 DIAGNOSIS — Z8249 Family history of ischemic heart disease and other diseases of the circulatory system: Secondary | ICD-10-CM | POA: Insufficient documentation

## 2024-01-30 ENCOUNTER — Other Ambulatory Visit: Payer: Self-pay

## 2024-01-30 DIAGNOSIS — I10 Essential (primary) hypertension: Secondary | ICD-10-CM

## 2024-01-30 MED ORDER — OLMESARTAN MEDOXOMIL 20 MG PO TABS: 20.0000 mg | ORAL_TABLET | Freq: Every day | ORAL | 1 refills | Status: DC

## 2024-02-11 NOTE — Progress Notes (Deleted)
 LILLETTE Kristeen JINNY Gladis, CMA,acting as a Neurosurgeon for Gaines Ada, FNP.,have documented all relevant documentation on the behalf of Gaines Ada, FNP,as directed by  Gaines Ada, FNP while in the presence of Gaines Ada, FNP.  Subjective:  Patient ID: Christopher Watson , male    DOB: 22-Aug-1965 , 58 y.o.   MRN: [id number]  No chief complaint on file.   HPI  HPI   Past Medical History:  Diagnosis Date   Hypertension    Mental disability    stuttering, learning disability father is legal guardian   Prostate cancer (HCC)      Family History  Problem Relation Age of Onset   Hypertension Father    Prostate cancer Father    Prostate cancer Brother      Current Outpatient Medications:    olmesartan  (BENICAR ) 20 MG tablet, Take 1 tablet (20 mg total) by mouth daily., Disp: 90 tablet, Rfl: 1   Allergies  Allergen Reactions   Bee Venom Swelling     Review of Systems   There were no vitals filed for this visit. There is no height or weight on file to calculate BMI.  Wt Readings from Last 3 Encounters:  07/17/23 213 lb 9.6 oz (96.9 kg)  01/17/23 210 lb 12.8 oz (95.6 kg)  08/20/22 199 lb (90.3 kg)    The 10-year ASCVD risk score (Arnett DK, et al., 2019) is: 14.3%   Values used to calculate the score:     Age: 83 years     Clincally relevant sex: Male     Is Non-Hispanic African American: Yes     Diabetic: No     Tobacco smoker: No     Systolic Blood Pressure: 138 mmHg     Is BP treated: Yes     HDL Cholesterol: 40 mg/dL     Total Cholesterol: 188 mg/dL  Objective:  Physical Exam      Assessment And Plan:  Essential hypertension    No follow-ups on file.  Patient was given opportunity to ask questions. Patient verbalized understanding of the plan and was able to repeat key elements of the plan. All questions were answered to their satisfaction.    LILLETTE Gaines Ada, FNP, have reviewed all documentation for this visit. The documentation on 02/11/24 for the exam, diagnosis,  procedures, and orders are all accurate and complete.   IF YOU HAVE BEEN REFERRED TO A SPECIALIST, IT MAY TAKE 1-2 WEEKS TO SCHEDULE/PROCESS THE REFERRAL. IF YOU HAVE NOT HEARD FROM US /SPECIALIST IN TWO WEEKS, PLEASE GIVE US  A CALL AT [phone] X [phone].

## 2024-02-12 ENCOUNTER — Ambulatory Visit: Payer: Self-pay | Admitting: Nurse Practitioner

## 2024-02-12 ENCOUNTER — Ambulatory Visit: Payer: Self-pay

## 2024-02-12 DIAGNOSIS — I10 Essential (primary) hypertension: Secondary | ICD-10-CM

## 2024-02-19 ENCOUNTER — Other Ambulatory Visit: Payer: Self-pay

## 2024-02-19 DIAGNOSIS — I10 Essential (primary) hypertension: Secondary | ICD-10-CM

## 2024-02-19 MED ORDER — OLMESARTAN MEDOXOMIL 20 MG PO TABS: 20.0000 mg | ORAL_TABLET | Freq: Every day | ORAL | 1 refills | Status: AC
